# Patient Record
Sex: Male | Born: 1975 | Race: White | Hispanic: No | Marital: Married | Smoking: Current every day smoker
Health system: Southern US, Community
[De-identification: ages and names within clinical notes are randomized; demographics above are authoritative.]

## PROBLEM LIST (undated history)

## (undated) HISTORY — PX: LEG SURGERY: SHX1003

---

## 1999-06-24 ENCOUNTER — Encounter: Payer: Self-pay | Admitting: Emergency Medicine

## 1999-06-24 ENCOUNTER — Emergency Department (HOSPITAL_COMMUNITY): Admission: EM | Admit: 1999-06-24 | Discharge: 1999-06-24 | Payer: Self-pay | Admitting: Emergency Medicine

## 2000-08-23 ENCOUNTER — Emergency Department (HOSPITAL_COMMUNITY): Admission: EM | Admit: 2000-08-23 | Discharge: 2000-08-23 | Payer: Self-pay | Admitting: Internal Medicine

## 2000-08-23 ENCOUNTER — Encounter: Payer: Self-pay | Admitting: Internal Medicine

## 2000-11-06 ENCOUNTER — Emergency Department (HOSPITAL_COMMUNITY): Admission: EM | Admit: 2000-11-06 | Discharge: 2000-11-06 | Payer: Self-pay | Admitting: Emergency Medicine

## 2000-11-13 ENCOUNTER — Emergency Department (HOSPITAL_COMMUNITY): Admission: EM | Admit: 2000-11-13 | Discharge: 2000-11-13 | Payer: Self-pay | Admitting: Emergency Medicine

## 2000-11-13 ENCOUNTER — Encounter: Payer: Self-pay | Admitting: Emergency Medicine

## 2001-01-08 ENCOUNTER — Emergency Department (HOSPITAL_COMMUNITY): Admission: EM | Admit: 2001-01-08 | Discharge: 2001-01-08 | Payer: Self-pay | Admitting: Emergency Medicine

## 2001-08-17 ENCOUNTER — Emergency Department (HOSPITAL_COMMUNITY): Admission: EM | Admit: 2001-08-17 | Discharge: 2001-08-18 | Payer: Self-pay | Admitting: Emergency Medicine

## 2001-08-18 ENCOUNTER — Encounter: Payer: Self-pay | Admitting: Emergency Medicine

## 2003-04-17 ENCOUNTER — Emergency Department (HOSPITAL_COMMUNITY): Admission: EM | Admit: 2003-04-17 | Discharge: 2003-04-17 | Payer: Self-pay | Admitting: Emergency Medicine

## 2003-08-31 ENCOUNTER — Emergency Department (HOSPITAL_COMMUNITY): Admission: EM | Admit: 2003-08-31 | Discharge: 2003-08-31 | Payer: Self-pay | Admitting: Emergency Medicine

## 2003-09-13 ENCOUNTER — Emergency Department (HOSPITAL_COMMUNITY): Admission: EM | Admit: 2003-09-13 | Discharge: 2003-09-13 | Payer: Self-pay | Admitting: Emergency Medicine

## 2004-07-22 ENCOUNTER — Emergency Department (HOSPITAL_COMMUNITY): Admission: EM | Admit: 2004-07-22 | Discharge: 2004-07-23 | Payer: Self-pay | Admitting: Emergency Medicine

## 2006-01-30 ENCOUNTER — Emergency Department (HOSPITAL_COMMUNITY): Admission: EM | Admit: 2006-01-30 | Discharge: 2006-01-31 | Payer: Self-pay | Admitting: Emergency Medicine

## 2006-01-31 ENCOUNTER — Emergency Department (HOSPITAL_COMMUNITY): Admission: EM | Admit: 2006-01-31 | Discharge: 2006-01-31 | Payer: Self-pay | Admitting: Emergency Medicine

## 2006-01-31 ENCOUNTER — Ambulatory Visit (HOSPITAL_COMMUNITY): Admission: RE | Admit: 2006-01-31 | Discharge: 2006-01-31 | Payer: Self-pay | Admitting: Emergency Medicine

## 2006-01-31 ENCOUNTER — Encounter: Payer: Self-pay | Admitting: Vascular Surgery

## 2006-05-19 ENCOUNTER — Emergency Department (HOSPITAL_COMMUNITY): Admission: EM | Admit: 2006-05-19 | Discharge: 2006-05-19 | Payer: Self-pay | Admitting: Emergency Medicine

## 2006-08-07 ENCOUNTER — Emergency Department (HOSPITAL_COMMUNITY): Admission: EM | Admit: 2006-08-07 | Discharge: 2006-08-07 | Payer: Self-pay | Admitting: Emergency Medicine

## 2006-09-16 ENCOUNTER — Emergency Department (HOSPITAL_COMMUNITY): Admission: EM | Admit: 2006-09-16 | Discharge: 2006-09-16 | Payer: Self-pay | Admitting: Emergency Medicine

## 2006-12-18 ENCOUNTER — Emergency Department (HOSPITAL_COMMUNITY): Admission: EM | Admit: 2006-12-18 | Discharge: 2006-12-18 | Payer: Self-pay | Admitting: Emergency Medicine

## 2008-02-14 ENCOUNTER — Emergency Department (HOSPITAL_COMMUNITY): Admission: EM | Admit: 2008-02-14 | Discharge: 2008-02-14 | Payer: Self-pay | Admitting: Emergency Medicine

## 2008-11-14 ENCOUNTER — Inpatient Hospital Stay (HOSPITAL_COMMUNITY): Admission: EM | Admit: 2008-11-14 | Discharge: 2008-11-14 | Payer: Self-pay | Admitting: Emergency Medicine

## 2008-11-29 ENCOUNTER — Emergency Department (HOSPITAL_COMMUNITY): Admission: EM | Admit: 2008-11-29 | Discharge: 2008-11-30 | Payer: Self-pay | Admitting: Emergency Medicine

## 2008-12-25 ENCOUNTER — Emergency Department (HOSPITAL_COMMUNITY): Admission: EM | Admit: 2008-12-25 | Discharge: 2008-12-25 | Payer: Self-pay | Admitting: Emergency Medicine

## 2010-06-09 ENCOUNTER — Emergency Department (HOSPITAL_COMMUNITY): Payer: Self-pay

## 2010-06-09 ENCOUNTER — Emergency Department (HOSPITAL_COMMUNITY)
Admission: EM | Admit: 2010-06-09 | Discharge: 2010-06-09 | Disposition: A | Discharge status: Home / Self Care | Payer: Self-pay | Attending: Emergency Medicine | Admitting: Emergency Medicine

## 2010-06-09 DIAGNOSIS — Z9889 Other specified postprocedural states: Secondary | ICD-10-CM | POA: Insufficient documentation

## 2010-06-09 DIAGNOSIS — S8990XA Unspecified injury of unspecified lower leg, initial encounter: Secondary | ICD-10-CM | POA: Insufficient documentation

## 2010-06-09 DIAGNOSIS — M25579 Pain in unspecified ankle and joints of unspecified foot: Secondary | ICD-10-CM | POA: Insufficient documentation

## 2010-06-09 DIAGNOSIS — W010XXA Fall on same level from slipping, tripping and stumbling without subsequent striking against object, initial encounter: Secondary | ICD-10-CM | POA: Insufficient documentation

## 2010-06-09 DIAGNOSIS — S99929A Unspecified injury of unspecified foot, initial encounter: Secondary | ICD-10-CM | POA: Insufficient documentation

## 2010-06-09 DIAGNOSIS — Y92009 Unspecified place in unspecified non-institutional (private) residence as the place of occurrence of the external cause: Secondary | ICD-10-CM | POA: Insufficient documentation

## 2010-06-09 DIAGNOSIS — F411 Generalized anxiety disorder: Secondary | ICD-10-CM | POA: Insufficient documentation

## 2010-07-24 LAB — DIFFERENTIAL
Basophils Relative: 1 % (ref 0–1)
Eosinophils Relative: 0 % (ref 0–5)
Lymphocytes Relative: 12 % (ref 12–46)
Monocytes Absolute: 0.5 10*3/uL (ref 0.1–1.0)
Neutrophils Relative %: 85 % — ABNORMAL HIGH (ref 43–77)

## 2010-07-24 LAB — CBC
HCT: 43.1 % (ref 39.0–52.0)
Hemoglobin: 14.9 g/dL (ref 13.0–17.0)
MCHC: 34.7 g/dL (ref 30.0–36.0)
RBC: 4.8 MIL/uL (ref 4.22–5.81)
WBC: 20.5 10*3/uL — ABNORMAL HIGH (ref 4.0–10.5)

## 2010-07-24 LAB — URINE MICROSCOPIC-ADD ON

## 2010-07-24 LAB — URINALYSIS, ROUTINE W REFLEX MICROSCOPIC
Glucose, UA: NEGATIVE mg/dL
Hgb urine dipstick: NEGATIVE
Nitrite: NEGATIVE
Protein, ur: 30 mg/dL — AB

## 2010-07-24 LAB — ETHANOL: Alcohol, Ethyl (B): 130 mg/dL — ABNORMAL HIGH (ref 0–10)

## 2010-07-24 LAB — POCT I-STAT, CHEM 8
BUN: 9 mg/dL (ref 6–23)
Calcium, Ion: 1.04 mmol/L — ABNORMAL LOW (ref 1.12–1.32)
Chloride: 105 mEq/L (ref 96–112)
HCT: 45 % (ref 39.0–52.0)
Hemoglobin: 15.3 g/dL (ref 13.0–17.0)
Potassium: 3.6 mEq/L (ref 3.5–5.1)

## 2010-07-24 LAB — URINE CULTURE
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
## 2010-11-29 ENCOUNTER — Emergency Department (HOSPITAL_COMMUNITY): Payer: Self-pay

## 2010-11-29 ENCOUNTER — Emergency Department (HOSPITAL_COMMUNITY)
Admission: EM | Admit: 2010-11-29 | Discharge: 2010-11-30 | Disposition: A | Discharge status: Home / Self Care | Payer: Self-pay | Attending: Emergency Medicine | Admitting: Emergency Medicine

## 2010-11-29 DIAGNOSIS — X500XXA Overexertion from strenuous movement or load, initial encounter: Secondary | ICD-10-CM | POA: Insufficient documentation

## 2010-11-29 DIAGNOSIS — S8990XA Unspecified injury of unspecified lower leg, initial encounter: Secondary | ICD-10-CM | POA: Insufficient documentation

## 2010-11-29 DIAGNOSIS — S99919A Unspecified injury of unspecified ankle, initial encounter: Secondary | ICD-10-CM | POA: Insufficient documentation

## 2010-11-29 DIAGNOSIS — M25569 Pain in unspecified knee: Secondary | ICD-10-CM | POA: Insufficient documentation

## 2010-11-29 DIAGNOSIS — R209 Unspecified disturbances of skin sensation: Secondary | ICD-10-CM | POA: Insufficient documentation

## 2011-02-02 LAB — BASIC METABOLIC PANEL
Creatinine, Ser: 0.88
GFR calc Af Amer: >60
Potassium: 3.9
Sodium: 140

## 2011-02-02 LAB — DIFFERENTIAL
Basophils Absolute: 0.1
Basophils Relative: 1
Eosinophils Relative: 3
Lymphocytes Relative: 25
Monocytes Relative: 5
Neutrophils Relative %: 66

## 2011-02-02 LAB — CBC
MCHC: 35.1
MCV: 86.1
RDW: 12.1
WBC: 9.9

## 2011-12-14 ENCOUNTER — Encounter (HOSPITAL_COMMUNITY): Payer: Self-pay | Admitting: Emergency Medicine

## 2011-12-14 ENCOUNTER — Emergency Department (HOSPITAL_COMMUNITY): Payer: Self-pay

## 2011-12-14 ENCOUNTER — Emergency Department (HOSPITAL_COMMUNITY)
Admission: EM | Admit: 2011-12-14 | Discharge: 2011-12-14 | Disposition: A | Discharge status: Home / Self Care | Payer: Self-pay | Attending: Emergency Medicine | Admitting: Emergency Medicine

## 2011-12-14 DIAGNOSIS — M5430 Sciatica, unspecified side: Secondary | ICD-10-CM | POA: Insufficient documentation

## 2011-12-14 DIAGNOSIS — F17200 Nicotine dependence, unspecified, uncomplicated: Secondary | ICD-10-CM | POA: Insufficient documentation

## 2011-12-14 DIAGNOSIS — S022XXA Fracture of nasal bones, initial encounter for closed fracture: Secondary | ICD-10-CM | POA: Insufficient documentation

## 2011-12-14 MED ORDER — IBUPROFEN 800 MG PO TABS: 600.0000 mg | ORAL_TABLET | Freq: Three times a day (TID) | ORAL | Status: AC

## 2011-12-14 MED ORDER — OXYCODONE-ACETAMINOPHEN 5-325 MG PO TABS
ORAL_TABLET | ORAL | Status: AC
  Administered 2011-12-14: 2 via ORAL
  Filled 2011-12-14: qty 2

## 2011-12-14 MED ORDER — OXYCODONE-ACETAMINOPHEN 5-325 MG PO TABS: 2.0000 | ORAL_TABLET | ORAL | Status: AC | PRN

## 2011-12-14 MED ORDER — OXYCODONE-ACETAMINOPHEN 5-325 MG PO TABS
2.0000 | ORAL_TABLET | Freq: Once | ORAL | Status: AC
  Administered 2011-12-14: 2 via ORAL

## 2011-12-14 MED ORDER — CYCLOBENZAPRINE HCL 10 MG PO TABS: 10.0000 mg | ORAL_TABLET | Freq: Two times a day (BID) | ORAL | Status: AC | PRN
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
## 2011-12-14 NOTE — ED Notes (Signed)
Patient with no complaints at this time. Respirations even and unlabored. Skin warm/dry. Discharge instructions reviewed with patient at this time. Patient given opportunity to voice concerns/ask questions. Patient discharged at this time and left Emergency Department with steady gait.   

## 2011-12-14 NOTE — ED Provider Notes (Signed)
Medical screening examination/treatment/procedure(s) were performed by non-physician practitioner and as supervising physician I was immediately available for consultation/collaboration.   Glynn Octave, MD 12/14/11 1520
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
## 2011-12-17 ENCOUNTER — Encounter (HOSPITAL_COMMUNITY): Payer: Self-pay | Admitting: Emergency Medicine

## 2011-12-17 ENCOUNTER — Emergency Department (HOSPITAL_COMMUNITY)
Admission: EM | Admit: 2011-12-17 | Discharge: 2011-12-17 | Disposition: A | Discharge status: Home / Self Care | Payer: Self-pay | Attending: Emergency Medicine | Admitting: Emergency Medicine

## 2011-12-17 DIAGNOSIS — F17200 Nicotine dependence, unspecified, uncomplicated: Secondary | ICD-10-CM | POA: Insufficient documentation

## 2011-12-17 DIAGNOSIS — L0501 Pilonidal cyst with abscess: Secondary | ICD-10-CM | POA: Insufficient documentation

## 2011-12-17 MED ORDER — DOXYCYCLINE HYCLATE 100 MG PO TABS
100.0000 mg | ORAL_TABLET | Freq: Once | ORAL | Status: AC
  Administered 2011-12-17: 100 mg via ORAL
  Filled 2011-12-17: qty 1

## 2011-12-17 MED ORDER — DOXYCYCLINE HYCLATE 100 MG PO CAPS: 100.0000 mg | ORAL_CAPSULE | Freq: Two times a day (BID) | ORAL | Status: AC

## 2011-12-17 NOTE — ED Notes (Signed)
Pt c/o boil to lower back x 4 days

## 2011-12-17 NOTE — ED Provider Notes (Signed)
History     CSN: [id number]  Arrival date & time 12/17/11  1544   First MD Initiated Contact with Patient 12/17/11 1726      Chief Complaint  Patient presents with  . Abscess    (Consider location/radiation/quality/duration/timing/severity/associated sxs/prior treatment) Patient is a 36 y.o. male presenting with abscess. The history is provided by the patient. No language interpreter was used.  Abscess  This is a new problem. Episode onset: 2 days ago. Affected Location: pilonidal region. The problem is moderate. The abscess is characterized by painfulness and draining. The abscess first occurred at home. Pertinent negatives include no fever. There were no sick contacts. He has received no recent medical care.    History reviewed. No pertinent past medical history.  Past Surgical History  Procedure Date  . Leg surgery     No family history on file.  History  Substance Use Topics  . Smoking status: Current Everyday Smoker    Types: Cigarettes  . Smokeless tobacco: Not on file  . Alcohol Use: Yes      Review of Systems  Constitutional: Negative for fever and chills.  Skin:       Abscess   All other systems reviewed and are negative.    Allergies  Review of patient's allergies indicates no known allergies.  Home Medications   Current Outpatient Rx  Name Route Sig Dispense Refill  . CYCLOBENZAPRINE HCL 10 MG PO TABS Oral Take 1 tablet (10 mg total) by mouth 2 (two) times daily as needed for muscle spasms. 15 tablet 0  . OXYCODONE-ACETAMINOPHEN 5-325 MG PO TABS Oral Take 2 tablets by mouth every 4 (four) hours as needed for pain. 15 tablet 0  . DOXYCYCLINE HYCLATE 100 MG PO CAPS Oral Take 1 capsule (100 mg total) by mouth 2 (two) times daily. 20 capsule 0  . IBUPROFEN 800 MG PO TABS Oral Take 1 tablet (800 mg total) by mouth 3 (three) times daily. 21 tablet 0    BP 142/87  Pulse 84  Temp 98.3 F (36.8 C)  Resp 16  Ht 6\' 4"  (1.93 m)  Wt 200 lb (90.719 kg)   BMI 24.34 kg/m2  SpO2 98%  Physical Exam  Nursing note and vitals reviewed. Constitutional: He is oriented to person, place, and time. He appears well-developed and well-nourished.  HENT:  Head: Normocephalic and atraumatic.  Eyes: EOM are normal.  Neck: Normal range of motion.  Cardiovascular: Normal rate, regular rhythm, normal heart sounds and intact distal pulses.   Pulmonary/Chest: Effort normal and breath sounds normal. No respiratory distress.  Abdominal: Soft. He exhibits no distension. There is no tenderness.  Musculoskeletal: Normal range of motion. He exhibits tenderness.       Back:  Neurological: He is alert and oriented to person, place, and time.  Skin: Skin is warm and dry.  Psychiatric: He has a normal mood and affect. Judgment normal.    ED Course  Procedures (including critical care time)  Labs Reviewed - No data to display No results found.   1. Pilonidal abscess       MDM  rx-doxycycline, 20 Warm compresses Return prn I&D not warranted presently b/c abscess is draining freely.        Evalina Field, [place] 12/17/11 [phone]

## 2011-12-17 NOTE — ED Provider Notes (Signed)
Medical screening examination/treatment/procedure(s) were performed by non-physician practitioner and as supervising physician I was immediately available for consultation/collaboration.   Hazen Brumett L Isair Inabinet, MD 12/17/11 2238 

## 2013-03-15 ENCOUNTER — Encounter (HOSPITAL_COMMUNITY): Payer: Self-pay | Admitting: Emergency Medicine

## 2013-03-15 ENCOUNTER — Emergency Department (HOSPITAL_COMMUNITY)
Admission: EM | Admit: 2013-03-15 | Discharge: 2013-03-15 | Disposition: A | Discharge status: Home / Self Care | Payer: Self-pay | Attending: Emergency Medicine | Admitting: Emergency Medicine

## 2013-03-15 DIAGNOSIS — F17200 Nicotine dependence, unspecified, uncomplicated: Secondary | ICD-10-CM | POA: Insufficient documentation

## 2013-03-15 DIAGNOSIS — K0889 Other specified disorders of teeth and supporting structures: Secondary | ICD-10-CM | POA: Diagnosis present

## 2013-03-15 DIAGNOSIS — K089 Disorder of teeth and supporting structures, unspecified: Secondary | ICD-10-CM | POA: Insufficient documentation

## 2013-03-15 DIAGNOSIS — L02219 Cutaneous abscess of trunk, unspecified: Secondary | ICD-10-CM | POA: Insufficient documentation

## 2013-03-15 DIAGNOSIS — IMO0002 Reserved for concepts with insufficient information to code with codable children: Secondary | ICD-10-CM | POA: Diagnosis present

## 2013-03-15 MED ORDER — PENICILLIN V POTASSIUM 500 MG PO TABS: 500.0000 mg | ORAL_TABLET | Freq: Four times a day (QID) | ORAL | Status: AC

## 2013-03-15 MED ORDER — OXYCODONE-ACETAMINOPHEN 5-325 MG PO TABS
1.0000 | ORAL_TABLET | Freq: Once | ORAL | Status: AC
  Administered 2013-03-15: 1 via ORAL
  Filled 2013-03-15: qty 1

## 2013-03-15 NOTE — ED Notes (Signed)
MD at bedside. 

## 2013-03-15 NOTE — ED Notes (Signed)
Suture cart at BS.  

## 2013-03-15 NOTE — ED Provider Notes (Signed)
CSN: [id number]     Arrival date & time 03/15/13  0431 History   First MD Initiated Contact with Patient 03/15/13 0448     Chief Complaint  Patient presents with  . Abscess   (Consider location/radiation/quality/duration/timing/severity/associated sxs/prior Treatment) Patient is a 37 y.o. male presenting with abscess.  Abscess Abscess location: LLQ of abdomen. Size:  4x4 cm Abscess quality: induration, painful, redness and warmth   Red streaking: no   Duration:  3 days Progression:  Worsening Pain details:    Quality:  Dull   Severity:  Mild   Duration:  3 days   Timing:  Constant   Progression:  Worsening Chronicity:  New Relieved by:  Nothing Worsened by:  Nothing tried Ineffective treatments:  None tried Associated symptoms: no fever, no headaches, no nausea and no vomiting     History reviewed. No pertinent past medical history. Past Surgical History  Procedure Laterality Date  . Leg surgery     No family history on file. History  Substance Use Topics  . Smoking status: Current Every Day Smoker    Types: Cigarettes  . Smokeless tobacco: Not on file  . Alcohol Use: Yes    Review of Systems  Constitutional: Negative for fever.  HENT: Negative for drooling and rhinorrhea.   Eyes: Negative for pain.  Respiratory: Negative for cough and shortness of breath.   Cardiovascular: Negative for chest pain and leg swelling.  Gastrointestinal: Negative for nausea, vomiting, abdominal pain and diarrhea.  Genitourinary: Negative for dysuria and hematuria.  Musculoskeletal: Negative for gait problem and neck pain.  Skin: Negative for color change.  Neurological: Negative for numbness and headaches.  Hematological: Negative for adenopathy.  Psychiatric/Behavioral: Negative for behavioral problems.  All other systems reviewed and are negative.    Allergies  Review of patient's allergies indicates no known allergies.  Home Medications  No current outpatient  prescriptions on file. BP 129/76  Pulse 103  Temp(Src) 98.3 F (36.8 C) (Oral)  Ht 6\' 4"  (1.93 m)  Wt 224 lb (101.606 kg)  BMI 27.28 kg/m2  SpO2 98% Physical Exam  Nursing note and vitals reviewed. Constitutional: He is oriented to person, place, and time. He appears well-developed and well-nourished.  HENT:  Head: Normocephalic and atraumatic.  Right Ear: External ear normal.  Left Ear: External ear normal.  Nose: Nose normal.  Mouth/Throat: Oropharynx is clear and moist. No oropharyngeal exudate.  Rennis Harding type III fracture which appears chronic to his right lower first molar.  Poor dentition diffusely. No trismus noted. Normal range of motion of his neck.  No perioral abscess noted. Normal appearing posterior oropharynx.  Eyes: Conjunctivae and EOM are normal. Pupils are equal, round, and reactive to light.  Neck: Normal range of motion. Neck supple.  Cardiovascular: Normal rate, regular rhythm, normal heart sounds and intact distal pulses.  Exam reveals no gallop and no friction rub.   No murmur heard. Pulmonary/Chest: Effort normal and breath sounds normal. No respiratory distress. He has no wheezes.  Abdominal: Soft. Bowel sounds are normal. He exhibits no distension. There is no tenderness. There is no rebound and no guarding.  4x4 cm area of erythema in the left lower quadrant of his abdomen. Mild induration noted.  Musculoskeletal: Normal range of motion. He exhibits no edema and no tenderness.  Neurological: He is alert and oriented to person, place, and time.  Skin: Skin is warm and dry.  Psychiatric: He has a normal mood and affect. His behavior is normal.  ED Course  INCISION AND DRAINAGE Date/Time: 03/15/2013 6:18 AM Performed by: Purvis Sheffield, S Authorized by: Purvis Sheffield, S Consent: Verbal consent obtained. Risks and benefits: risks, benefits and alternatives were discussed Consent given by: patient Patient understanding: patient states  understanding of the procedure being performed Required items: required blood products, implants, devices, and special equipment available Patient identity confirmed: verbally with patient, arm band, provided demographic data and hospital-assigned identification number Time out: Immediately prior to procedure a "time out" was called to verify the correct patient, procedure, equipment, support staff and site/side marked as required. Type: abscess Body area: trunk Location details: abdomen Anesthesia: local infiltration Local anesthetic: lidocaine 1% with epinephrine Anesthetic total: 5 ml Patient sedated: no Scalpel size: 11 Incision type: single straight Complexity: simple Drainage: purulent Drainage amount: moderate Wound treatment: wound left open Patient tolerance: Patient tolerated the procedure well with no immediate complications.   (including critical care time) Labs Review Labs Reviewed - No data to display Imaging Review No results found.  EKG Interpretation   None       MDM   1. Pain, dental   2. Abdominal abscess    5:10 AM 37 y.o. male who presents with dental pain and an abscess in the left lower quadrant of his abdomen both which began several days ago. The patient has a history of cutaneous body abscesses which she has required incision and drainage in the past. 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 He notes that it has only started to her in the last few days. He denies any fevers. He is afebrile and slightly tachycardic here. He reports that he has had alcohol to drink this evening. Bedside ultrasound shows fluid accumulation in the abscess in his left lower quadrant which I will incise and drain. No obvious peri oral abscess. Will recommend followup with dentistry for that.  6:17 AM: I performed the incision and drainage of the abdominal abscess without complication. I also applied  calcium hydroxide to the fractured tooth. Will have the patient followup with dentistry. I have discussed the diagnosis/risks/treatment options with the patient and family and believe the pt to be eligible for discharge home to follow-up with a dentist next week. We also discussed returning to the ED immediately if new or worsening sx occur. We discussed the sx which are most concerning (e.g., worsening pain, fever, difficulty handling secretions, inc redness around abscess) that necessitate immediate return. Any new prescriptions provided to the patient are listed below.  New Prescriptions   PENICILLIN V POTASSIUM (VEETID) 500 MG TABLET    Take 1 tablet (500 mg total) by mouth 4 (four) times daily.       Junius Argyle, MD 03/15/13 [phone]

## 2013-03-15 NOTE — ED Notes (Signed)
BP: 140/90, HR: 120, RR: 20  Pt presents to the department with two abscesses. One abscess is on the pt's lower left abdomen, pt states it has increased in size greatly in the past day, pt states he first noticed the abscess three days ago. Pt also has an abscess on his lower left back side of his mouth that is causing a terrible HA. Pt states he has drank 2 40s tonight.

## 2013-05-07 ENCOUNTER — Telehealth (HOSPITAL_COMMUNITY): Payer: Self-pay

## 2013-05-07 ENCOUNTER — Emergency Department (HOSPITAL_COMMUNITY)
Admission: EM | Admit: 2013-05-07 | Discharge: 2013-05-07 | Disposition: A | Discharge status: Home / Self Care | Payer: Self-pay | Attending: Emergency Medicine | Admitting: Emergency Medicine

## 2013-05-07 ENCOUNTER — Encounter (HOSPITAL_COMMUNITY): Payer: Self-pay | Admitting: Emergency Medicine

## 2013-05-07 DIAGNOSIS — R000 Tachycardia, unspecified: Secondary | ICD-10-CM | POA: Insufficient documentation

## 2013-05-07 DIAGNOSIS — K029 Dental caries, unspecified: Secondary | ICD-10-CM | POA: Insufficient documentation

## 2013-05-07 DIAGNOSIS — F17200 Nicotine dependence, unspecified, uncomplicated: Secondary | ICD-10-CM | POA: Insufficient documentation

## 2013-05-07 DIAGNOSIS — K047 Periapical abscess without sinus: Secondary | ICD-10-CM | POA: Insufficient documentation

## 2013-05-07 MED ORDER — PENICILLIN V POTASSIUM 250 MG PO TABS
500.0000 mg | ORAL_TABLET | Freq: Once | ORAL | Status: AC
  Administered 2013-05-07: 500 mg via ORAL
  Filled 2013-05-07: qty 2

## 2013-05-07 MED ORDER — IBUPROFEN 800 MG PO TABS: 800.0000 mg | ORAL_TABLET | Freq: Three times a day (TID) | ORAL | Status: AC

## 2013-05-07 MED ORDER — PENICILLIN V POTASSIUM 500 MG PO TABS: 500.0000 mg | ORAL_TABLET | Freq: Three times a day (TID) | ORAL | Status: DC

## 2013-05-07 NOTE — ED Notes (Signed)
Pt. reports worsening  right lower molar pain with swelling for several weeks unrelieved by OTC pain medications .

## 2013-05-07 NOTE — ED Provider Notes (Signed)
CSN: [id number]     Arrival date & time 05/07/13  0522 History   First MD Initiated Contact with Patient 05/07/13 [phone]     Chief Complaint  Patient presents with  . Dental Pain   (Consider location/radiation/quality/duration/timing/severity/associated sxs/prior Treatment) HPI Comments: Patient is 38 year old male who presents with a 5 day history of right lower dental pain - he has been using "BC Powders" which he reports relieves the pain but has not done anything with the facial swelling.  He states that he cannot afford to see a dentist at the present time but just needs an antibiotic for the swelling.  He denies fever, chills, difficulty swallowing, inability to open his mouth.    Patient is a 38 y.o. male presenting with tooth pain. The history is provided by the patient. No language interpreter was used.  Dental Pain Location:  Lower Lower teeth location:  29/RL 2nd bicuspid Quality:  Dull and throbbing Severity:  Moderate Onset quality:  Gradual Duration:  5 days Timing:  Constant Progression:  Worsening Chronicity:  New Context: abscess, dental caries and poor dentition   Context: not trauma   Relieved by:  Nothing Worsened by:  Nothing tried Ineffective treatments:  None tried Associated symptoms: facial swelling and gum swelling   Associated symptoms: no difficulty swallowing, no drooling, no fever, no neck swelling, no oral bleeding, no oral lesions and no trismus   Risk factors: lack of dental care, periodontal disease and smoking     History reviewed. No pertinent past medical history. Past Surgical History  Procedure Laterality Date  . Leg surgery     No family history on file. History  Substance Use Topics  . Smoking status: Current Every Day Smoker    Types: Cigarettes  . Smokeless tobacco: Not on file  . Alcohol Use: Yes    Review of Systems  Constitutional: Negative for fever.  HENT: Positive for facial swelling. Negative for drooling and mouth sores.    All other systems reviewed and are negative.    Allergies  Review of patient's allergies indicates no known allergies.  Home Medications   Current Outpatient Rx  Name  Route  Sig  Dispense  Refill  . Aspirin-Salicylamide-Caffeine (BC HEADACHE POWDER PO)   Oral   Take 1 packet by mouth 3 (three) times daily as needed (pain).          BP 147/100  Pulse 120  Temp(Src) 98.6 F (37 C) (Oral)  Resp 18  SpO2 95% Physical Exam  Nursing note and vitals reviewed. Constitutional: He is oriented to person, place, and time. He appears well-developed and well-nourished. No distress.  HENT:  Head: Normocephalic and atraumatic.    Right Ear: External ear normal.  Left Ear: External ear normal.  Nose: Nose normal.  Mouth/Throat: No trismus in the jaw. Dental abscesses and dental caries present. No oropharyngeal exudate.    Eyes: Conjunctivae are normal. Pupils are equal, round, and reactive to light. No scleral icterus.  Neck: Normal range of motion. Neck supple.  Cardiovascular: Regular rhythm and normal heart sounds.  Exam reveals no gallop and no friction rub.   No murmur heard. tachycardia  Pulmonary/Chest: Effort normal and breath sounds normal. No respiratory distress. He has no wheezes. He has no rales.  Abdominal: Soft. Bowel sounds are normal. He exhibits no distension. There is no tenderness.  Musculoskeletal: Normal range of motion. He exhibits no edema and no tenderness.  Lymphadenopathy:    He has no cervical  adenopathy.  Neurological: He is alert and oriented to person, place, and time. He exhibits normal muscle tone. Coordination normal.  Skin: Skin is warm and dry. No rash noted. No erythema. No pallor.  Psychiatric: He has a normal mood and affect. His behavior is normal. Judgment and thought content normal.    ED Course  Procedures (including critical care time) Labs Review Labs Reviewed - No data to display Imaging Review No results found.  EKG  Interpretation   None       MDM  Right periapical abscess  Patient here with worsening right lower pre-molar dental pain - no fever, chills, trismus.  Noted with abscess, but not fluctuant at this time.  Not drainable.  Doubt deep soft tissue infection, Ludwig's angina.     Izola PriceFrances C. Marisue HumbleSanford, New JerseyPA-C 05/07/13 [phone]

## 2013-05-07 NOTE — Discharge Instructions (Signed)
Dental Abscess A dental abscess is a collection of infected fluid (pus) from a bacterial infection in the inner part of the tooth (pulp). It usually occurs at the end of the tooth's root.  CAUSES   Severe tooth decay.  Trauma to the tooth that allows bacteria to enter into the pulp, such as a broken or chipped tooth. SYMPTOMS   Severe pain in and around the infected tooth.  Swelling and redness around the abscessed tooth or in the mouth or face.  Tenderness.  Pus drainage.  Bad breath.  Bitter taste in the mouth.  Difficulty swallowing.  Difficulty opening the mouth.  Nausea.  Vomiting.  Chills.  Swollen neck glands. DIAGNOSIS   A medical and dental history will be taken.  An examination will be performed by tapping on the abscessed tooth.  X-rays may be taken of the tooth to identify the abscess. TREATMENT The goal of treatment is to eliminate the infection. You may be prescribed antibiotic medicine to stop the infection from spreading. A root canal may be performed to save the tooth. If the tooth cannot be saved, it may be pulled (extracted) and the abscess may be drained.  HOME CARE INSTRUCTIONS  Only take over-the-counter or prescription medicines for pain, fever, or discomfort as directed by your caregiver.  Rinse your mouth (gargle) often with salt water ( tsp salt in 8 oz [250 ml] of warm water) to relieve pain or swelling.  Do not drive after taking pain medicine (narcotics).  Do not apply heat to the outside of your face.  Return to your dentist for further treatment as directed. SEEK MEDICAL CARE IF:  Your pain is not helped by medicine.  Your pain is getting worse instead of better. SEEK IMMEDIATE MEDICAL CARE IF:  You have a fever or persistent symptoms for more than 2 3 days.  You have a fever and your symptoms suddenly get worse.  You have chills or a very bad headache.  You have problems breathing or swallowing.  You have trouble  opening your mouth.  You have swelling in the neck or around the eye. Document Released: 04/03/2005 Document Revised: 12/27/2011 Document Reviewed: 07/12/2010 [facility] Patient Information 2014 [place], [place].  Dental Pain A tooth ache may be caused by cavities (tooth decay). Cavities expose the nerve of the tooth to air and hot or cold temperatures. It may come from an infection or abscess (also called a boil or furuncle) around your tooth. It is also often caused by dental caries (tooth decay). This causes the pain you are having. DIAGNOSIS  Your caregiver can diagnose this problem by exam. TREATMENT   If caused by an infection, it may be treated with medications which kill germs (antibiotics) and pain medications as prescribed by your caregiver. Take medications as directed.  Only take over-the-counter or prescription medicines for pain, discomfort, or fever as directed by your caregiver.  Whether the tooth ache today is caused by infection or dental disease, you should see your dentist as soon as possible for further care. SEEK MEDICAL CARE IF: The exam and treatment you received today has been provided on an emergency basis only. This is not a substitute for complete medical or dental care. If your problem worsens or new problems (symptoms) appear, and you are unable to meet with your dentist, call or return to this location. SEEK IMMEDIATE MEDICAL CARE IF:   You have a fever.  You develop redness and swelling of your face, jaw, or neck.  dental care. If your problem worsens or new problems (symptoms) appear, and you are unable to meet with your dentist, call or return to this location.  SEEK IMMEDIATE MEDICAL CARE IF:    You have a fever.   You develop redness and swelling of your face, jaw, or neck.   You are unable to open your mouth.   You have severe pain uncontrolled by pain medicine.  MAKE SURE YOU:    Understand these instructions.   Will watch your condition.   Will get help right away if you are not doing well or get worse.  Document Released: 04/03/2005 Document Revised: 06/26/2011 Document Reviewed: 11/20/2007  [employer] Patient Information 2014 [employer].

## 2013-05-07 NOTE — ED Notes (Signed)
Pt calling for on call dental provider for time of ED visit.  Reviewed On call provider list.  No provider on call for Dental, pt informed

## 2013-05-09 NOTE — ED Provider Notes (Signed)
Medical screening examination/treatment/procedure(s) were performed by non-physician practitioner and as supervising physician I was immediately available for consultation/collaboration.  EKG Interpretation   None         Candyce ChurnJohn David Mollyann Halbert, MD 05/09/13 [phone]

## 2014-03-30 ENCOUNTER — Emergency Department (HOSPITAL_COMMUNITY)
Admission: EM | Admit: 2014-03-30 | Discharge: 2014-03-30 | Disposition: A | Discharge status: Home / Self Care | Payer: Self-pay | Attending: Emergency Medicine | Admitting: Emergency Medicine

## 2014-03-30 ENCOUNTER — Encounter (HOSPITAL_COMMUNITY): Payer: Self-pay | Admitting: Emergency Medicine

## 2014-03-30 DIAGNOSIS — Z791 Long term (current) use of non-steroidal anti-inflammatories (NSAID): Secondary | ICD-10-CM | POA: Insufficient documentation

## 2014-03-30 DIAGNOSIS — Z720 Tobacco use: Secondary | ICD-10-CM | POA: Insufficient documentation

## 2014-03-30 DIAGNOSIS — Z792 Long term (current) use of antibiotics: Secondary | ICD-10-CM | POA: Insufficient documentation

## 2014-03-30 DIAGNOSIS — L0231 Cutaneous abscess of buttock: Secondary | ICD-10-CM | POA: Insufficient documentation

## 2014-03-30 DIAGNOSIS — L02214 Cutaneous abscess of groin: Secondary | ICD-10-CM | POA: Insufficient documentation

## 2014-03-30 MED ORDER — OXYCODONE-ACETAMINOPHEN 5-325 MG PO TABS
1.0000 | ORAL_TABLET | Freq: Once | ORAL | Status: AC
  Administered 2014-03-30: 1 via ORAL
  Filled 2014-03-30: qty 1

## 2014-03-30 MED ORDER — DOXYCYCLINE HYCLATE 100 MG PO CAPS: 100.0000 mg | ORAL_CAPSULE | Freq: Two times a day (BID) | ORAL | Status: AC

## 2014-03-30 MED ORDER — OXYCODONE-ACETAMINOPHEN 5-325 MG PO TABS: 1.0000 | ORAL_TABLET | Freq: Three times a day (TID) | ORAL | Status: DC | PRN

## 2014-03-30 MED ORDER — LIDOCAINE-EPINEPHRINE (PF) 2 %-1:200000 IJ SOLN
20.0000 mL | Freq: Once | INTRAMUSCULAR | Status: AC
  Administered 2014-03-30: 20 mL via INTRADERMAL
  Filled 2014-03-30: qty 20

## 2014-03-30 NOTE — Discharge Instructions (Signed)
Please continue to apply warm compress to affected region several times daily.  Wash it with dial antibacteria soap.  Take pain medication and antibiotic as prescribed.  If no improvement [facility].  Abscess Care After An abscess (also called a boil or furuncle) is an infected area that contains a collection of pus. Signs and symptoms of an abscess include pain, tenderness, redness, or hardness, or you may feel a moveable soft area under your skin. An abscess can occur anywhere in the body. The infection may spread to surrounding tissues causing cellulitis. A cut (incision) by the surgeon was made over your abscess and the pus was drained out. Gauze may have been packed into the space to provide a drain that will allow the cavity to heal from the inside outwards. The boil may be painful for 5 to 7 days. Most people with a boil do not have high fevers. Your abscess, if seen early, may not have localized, and may not have been lanced. If not, another appointment may be required for this if it does not get better on its own or with medications. HOME CARE INSTRUCTIONS   Only take over-the-counter or prescription medicines for pain, discomfort, or fever as directed by your caregiver.  When you bathe, soak and then remove gauze or iodoform packs at least daily or as directed by your caregiver. You may then wash the wound gently with mild soapy water. Repack with gauze or do as your caregiver directs. SEEK IMMEDIATE MEDICAL CARE IF:   You develop increased pain, swelling, redness, drainage, or bleeding in the wound site.  You develop signs of generalized infection including muscle aches, chills, fever, or a general ill feeling.  An oral temperature above 102 F (38.9 C) develops, not controlled by medication. See your caregiver for a recheck if you develop any of the symptoms described above. If medications (antibiotics) were prescribed, take them as  directed. Document Released: 10/20/2004 Document Revised: 06/26/2011 Document Reviewed: 06/17/2007 [facility] Patient Information 2015 [place]. This information is not intended to replace advice given to you by your health care provider. Make sure you discuss any questions you have with your health care provider.

## 2014-03-30 NOTE — ED Notes (Addendum)
Pt has abscess near anus and on scrotum. Pt rates pain 10/10.

## 2014-03-30 NOTE — ED Notes (Signed)
[facility]. Pt has two boils one on scrotum, and the other to the back of left leg. 10/10 pain.

## 2014-03-30 NOTE — ED Provider Notes (Signed)
CSN: [id number]     Arrival date & time 03/30/14  0039 History   First MD Initiated Contact with Patient 03/30/14 0043     Chief Complaint  Patient presents with  . 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 Patient was brought here via [facility] from home.  Patient states for the past 2 weeks he has noticed an abscess one on his scrotum and the other one is to his buttock. Symptoms get progressively worse, not improve with over-the-counter treatments. He tries to number with alcohol today with minimal relief. He is now complaining of 10 out of 10 sharp throbbing pain. No associated fever, chills, abdominal pain, dysuria, hematochezia or melena. Denies any penile pain, or discharge. No dysuria. No history of immunocompromise.  History reviewed. No pertinent past medical history. Past Surgical History  Procedure Laterality Date  . Leg surgery     History reviewed. No pertinent family history. History  Substance Use Topics  . Smoking status: Current Every Day Smoker    Types: Cigarettes  . Smokeless tobacco: Not on file  . Alcohol Use: Yes    Review of Systems  Constitutional: Negative for fever.  Skin: Positive for rash.      Allergies  Review of patient's allergies indicates no known allergies.  Home Medications   Prior to Admission medications   Medication Sig Start Date End Date Taking? Authorizing Provider  Aspirin-Salicylamide-Caffeine (BC HEADACHE POWDER PO) Take 1 packet by mouth 3 (three) times daily as needed (pain).    Historical Provider, MD  ibuprofen (ADVIL,MOTRIN) 800 MG tablet Take 1 tablet (800 mg total) by mouth 3 (three) times daily. 05/07/13   Izola PriceFrances C. Sanford, PA-C  penicillin v potassium (VEETID) 500 MG tablet Take 1 tablet (500 mg total) by mouth 3 (three) times daily. 05/07/13    Izola PriceFrances C. Sanford, PA-C   BP 140/90 mmHg  Pulse 112  Resp 18  SpO2 98% Physical Exam  Constitutional: He appears well-developed and well-nourished. No distress.  HENT:  Head: Atraumatic.  Eyes: Conjunctivae are normal.  Neck: Normal range of motion. Neck supple.  Neurological: He is alert.  Skin: Rash (L inguinal region with several cutaneous abscess without cellulitis; TTP.  L medial buttock with cutaneous abscess adjacent to rectum without rectal involvement, no cellulitis, ttp.) noted.  Psychiatric: He has a normal mood and affect.    ED Course  Procedures (including critical care time)  1:17 AM Pt has multiple small abscesses in his L inguinal region and a moderate size abscess noted to L gluteal region which i performed I&D with small amount of pustular drainage.  No cellulitis.  No rectal involvement, no systemic involvement.  Will d/c with pain meds, and abx.  Will refer to [facility] for further care if no improvement.  Care instruction provided.   INCISION AND DRAINAGE Performed by: Fayrene HelperRAN,Fedrick Cefalu Consent: Verbal consent obtained. Risks and benefits: risks, benefits and alternatives were discussed Type: abscess  Body area: L medial gluteal region  Anesthesia: local infiltration  Incision was made with a scalpel.  Local anesthetic: lidocaine 2% w epinephrine  Anesthetic total: 3 ml  Complexity: complex Blunt dissection to break up loculations  Drainage: purulent  Drainage amount: small  Packing material: none Patient tolerance: Patient tolerated the procedure well with no immediate complications.     Labs Review Labs Reviewed - No data to display  Imaging Review No  results found.   EKG Interpretation None      MDM   Final diagnoses:  Left buttock abscess  Soft tissue abscess of inguinal region    BP 127/76 mmHg  Pulse 94  Temp(Src) 97.7 F (36.5 C) (Oral)  Resp 20  SpO2 98%     Fayrene HelperBowie Elijahjames Fuelling, PA-C 03/30/14 0123  Geoffery Lyonsouglas Delo, MD 03/30/14  [phone]

## 2014-04-21 ENCOUNTER — Encounter (HOSPITAL_COMMUNITY): Payer: Self-pay | Admitting: Emergency Medicine

## 2014-04-21 ENCOUNTER — Emergency Department (HOSPITAL_COMMUNITY)
Admission: EM | Admit: 2014-04-21 | Discharge: 2014-04-21 | Disposition: A | Discharge status: Home / Self Care | Payer: Self-pay | Attending: Emergency Medicine | Admitting: Emergency Medicine

## 2014-04-21 ENCOUNTER — Emergency Department (HOSPITAL_COMMUNITY): Payer: Self-pay

## 2014-04-21 DIAGNOSIS — Y998 Other external cause status: Secondary | ICD-10-CM | POA: Insufficient documentation

## 2014-04-21 DIAGNOSIS — Z7982 Long term (current) use of aspirin: Secondary | ICD-10-CM | POA: Insufficient documentation

## 2014-04-21 DIAGNOSIS — Y92410 Unspecified street and highway as the place of occurrence of the external cause: Secondary | ICD-10-CM | POA: Insufficient documentation

## 2014-04-21 DIAGNOSIS — R52 Pain, unspecified: Secondary | ICD-10-CM

## 2014-04-21 DIAGNOSIS — M549 Dorsalgia, unspecified: Secondary | ICD-10-CM

## 2014-04-21 DIAGNOSIS — M25561 Pain in right knee: Secondary | ICD-10-CM

## 2014-04-21 DIAGNOSIS — Z791 Long term (current) use of non-steroidal anti-inflammatories (NSAID): Secondary | ICD-10-CM | POA: Insufficient documentation

## 2014-04-21 DIAGNOSIS — T148XXA Other injury of unspecified body region, initial encounter: Secondary | ICD-10-CM

## 2014-04-21 DIAGNOSIS — S3992XA Unspecified injury of lower back, initial encounter: Secondary | ICD-10-CM | POA: Insufficient documentation

## 2014-04-21 DIAGNOSIS — S82001A Unspecified fracture of right patella, initial encounter for closed fracture: Secondary | ICD-10-CM | POA: Insufficient documentation

## 2014-04-21 DIAGNOSIS — Y9389 Activity, other specified: Secondary | ICD-10-CM | POA: Insufficient documentation

## 2014-04-21 DIAGNOSIS — Z720 Tobacco use: Secondary | ICD-10-CM | POA: Insufficient documentation

## 2014-04-21 DIAGNOSIS — Z79899 Other long term (current) drug therapy: Secondary | ICD-10-CM | POA: Insufficient documentation

## 2014-04-21 MED ORDER — OXYCODONE-ACETAMINOPHEN 5-325 MG PO TABS: 1.0000 | ORAL_TABLET | Freq: Three times a day (TID) | ORAL | Status: AC | PRN

## 2014-04-21 MED ORDER — OXYCODONE-ACETAMINOPHEN 5-325 MG PO TABS
1.0000 | ORAL_TABLET | Freq: Once | ORAL | Status: AC
  Administered 2014-04-21: 1 via ORAL
  Filled 2014-04-21: qty 1

## 2014-04-21 NOTE — Discharge Instructions (Signed)
Please call your doctor for a followup appointment within 24-48 hours. When you talk to your doctor please let them know that you were seen in the emergency department and have them acquire all of your records so that they can discuss the findings with you and formulate a treatment plan to fully care for your new and ongoing problems. Please call and set-up an appointment with Dr. Ophelia Charter, orthopedics physician regarding knee pain  Please rest and stay hydrated Please avoid any physical or strenuous activity  Please take medications as prescribed - while on pain medications there is to be no drinking alcohol, driving, operating any heavy machinery. If extra please dispose in a proper manner. Please do not take any extra Tylenol with this medication for this can lead to Tylenol overdose and liver issues.  Please continue to monitor symptoms closely and if symptoms are to worsen or change (fever greater than 101, chills, sweating, nausea, vomiting, chest pain, shortness of breathe, difficulty breathing, weakness, numbness, tingling, worsening or changes to pain pattern, fall, injury, loss of sensation, red streaks running down the leg) please report back to the Emergency Department immediately.   Knee Pain The knee is the complex joint between your thigh and your lower leg. It is made up of bones, tendons, ligaments, and cartilage. The bones that make up the knee are:  The femur in the thigh.  The tibia and fibula in the lower leg.  The patella or kneecap riding in the groove on the lower femur. CAUSES  Knee pain is a common complaint with many causes. A few of these causes are:  Injury, such as:  A ruptured ligament or tendon injury.  Torn cartilage.  Medical conditions, such as:  Gout  Arthritis  Infections  Overuse, over training, or overdoing a physical activity. Knee pain can be minor or severe. Knee pain can accompany debilitating injury. Minor knee problems often respond well to  self-care measures or get well on their own. More serious injuries may need medical intervention or even surgery. SYMPTOMS The knee is complex. Symptoms of knee problems can vary widely. Some of the problems are:  Pain with movement and weight bearing.  Swelling and tenderness.  Buckling of the knee.  Inability to straighten or extend your knee.  Your knee locks and you cannot straighten it.  Warmth and redness with pain and fever.  Deformity or dislocation of the kneecap. DIAGNOSIS  Determining what is wrong may be very straight forward such as when there is an injury. It can also be challenging because of the complexity of the knee. Tests to make a diagnosis may include:  Your caregiver taking a history and doing a physical exam.  Routine X-rays can be used to rule out other problems. X-rays will not reveal a cartilage tear. Some injuries of the knee can be diagnosed by:  Arthroscopy a surgical technique by which a small video camera is inserted through tiny incisions on the sides of the knee. This procedure is used to examine and repair internal knee joint problems. Tiny instruments can be used during arthroscopy to repair the torn knee cartilage (meniscus).  Arthrography is a radiology technique. A contrast liquid is directly injected into the knee joint. Internal structures of the knee joint then become visible on X-ray film.  An MRI scan is a non X-ray radiology procedure in which magnetic fields and a computer produce two- or three-dimensional images of the inside of the knee. Cartilage tears are often visible using an  MRI scanner. MRI scans have largely replaced arthrography in diagnosing cartilage tears of the knee.  Blood work.  Examination of the fluid that helps to lubricate the knee joint (synovial fluid). This is done by taking a sample out using a needle and a syringe. TREATMENT The treatment of knee problems depends on the cause. Some of these treatments  are:  Depending on the injury, proper casting, splinting, surgery, or physical therapy care will be needed.  Give yourself adequate recovery time. Do not overuse your joints. If you begin to get sore during workout routines, back off. Slow down or do fewer repetitions.  For repetitive activities such as cycling or running, maintain your strength and nutrition.  Alternate muscle groups. For example, if you are a weight lifter, work the upper body on one day and the lower body the next.  Either tight or weak muscles do not give the proper support for your knee. Tight or weak muscles do not absorb the stress placed on the knee joint. Keep the muscles surrounding the knee strong.  Take care of mechanical problems.  If you have flat feet, orthotics or special shoes may help. See your caregiver if you need help.  Arch supports, sometimes with wedges on the inner or outer aspect of the heel, can help. These can shift pressure away from the side of the knee most bothered by osteoarthritis.  A brace called an "unloader" brace also may be used to help ease the pressure on the most arthritic side of the knee.  If your caregiver has prescribed crutches, braces, wraps or ice, use as directed. The acronym for this is PRICE. This means protection, rest, ice, compression, and elevation.  Nonsteroidal anti-inflammatory drugs (NSAIDs), can help relieve pain. But if taken immediately after an injury, they may actually increase swelling. Take NSAIDs with food in your stomach. Stop them if you develop stomach problems. Do not take these if you have a history of ulcers, stomach pain, or bleeding from the bowel. Do not take without your caregiver's approval if you have problems with fluid retention, heart failure, or kidney problems.  For ongoing knee problems, physical therapy may be helpful.  Glucosamine and chondroitin are over-the-counter dietary supplements. Both may help relieve the pain of osteoarthritis  in the knee. These medicines are different from the usual anti-inflammatory drugs. Glucosamine may decrease the rate of cartilage destruction.  Injections of a corticosteroid drug into your knee joint may help reduce the symptoms of an arthritis flare-up. They may provide pain relief that lasts a few months. You may have to wait a few months between injections. The injections do have a small increased risk of infection, water retention, and elevated blood sugar levels.  Hyaluronic acid injected into damaged joints may ease pain and provide lubrication. These injections may work by reducing inflammation. A series of shots may give relief for as long as 6 months.  Topical painkillers. Applying certain ointments to your skin may help relieve the pain and stiffness of osteoarthritis. Ask your pharmacist for suggestions. Many over the-counter products are approved for temporary relief of arthritis pain.  In some countries, doctors often prescribe topical NSAIDs for relief of chronic conditions such as arthritis and tendinitis. A review of treatment with NSAID creams found that they worked as well as oral medications but without the serious side effects. PREVENTION  Maintain a healthy weight. Extra pounds put more strain on your joints.  Get strong, stay limber. Weak muscles are a common cause of  knee injuries. Stretching is important. Include flexibility exercises in your workouts.  Be smart about exercise. If you have osteoarthritis, chronic knee pain or recurring injuries, you may need to change the way you exercise. This does not mean you have to stop being active. If your knees ache after jogging or playing basketball, consider switching to swimming, water aerobics, or other low-impact activities, at least for a few days a week. Sometimes limiting high-impact activities will provide relief.  Make sure your shoes fit well. Choose footwear that is right for your sport.  Protect your knees. Use the  proper gear for knee-sensitive activities. Use kneepads when playing volleyball or laying carpet. Buckle your seat belt every time you drive. Most shattered kneecaps occur in car accidents.  Rest when you are tired. SEEK MEDICAL CARE IF:  You have knee pain that is continual and does not seem to be getting better.  SEEK IMMEDIATE MEDICAL CARE IF:  Your knee joint feels hot to the touch and you have a high fever. MAKE SURE YOU:   Understand these instructions.  Will watch your condition.  Will get help right away if you are not doing well or get worse. Document Released: 01/29/2007 Document Revised: 06/26/2011 Document Reviewed: 01/29/2007 [facility] Patient Information 2015 [employer], [place]. This information is not intended to replace advice given to you by your health care provider. Make sure you discuss any questions you have with your health care provider.   Emergency Department Resource Guide 1) Find a Doctor and Pay Out of Pocket Although you won't have to find out who is covered by your insurance plan, it is a good idea to ask around and get recommendations. You will then need to call the office and see if the doctor you have chosen will accept you as a new patient and what types of options they offer for patients who are self-pay. Some doctors offer discounts or will set up payment plans for their patients who do not have insurance, but you will need to ask so you aren't surprised when you get to your appointment.  2) Contact Your Local Health Department Not all health departments have doctors that can see patients for sick visits, but many do, so it is worth a call to see if yours does. If you don't know where your local health department is, you can check in your phone book. The CDC also has a tool to help you locate your state's health department, and many state websites also have listings of all of their local health departments.  3) Find a Walk-in Clinic If your illness is not  likely to be very severe or complicated, you may want to try a walk in clinic. These are popping up all over the country in pharmacies, drugstores, and shopping centers. They're usually staffed by nurse practitioners or physician assistants that have been trained to treat common illnesses and complaints. They're usually fairly quick and inexpensive. However, if you have serious medical issues or chronic medical problems, these are probably not your best option.  No Primary Care Doctor: - Call Health Connect at  [phone] - they can help you locate a primary care doctor that  accepts your insurance, provides certain services, etc. - Physician Referral Service- [phone]  Chronic Pain Problems: Organization         Address  Phone   Notes  [facility]  [phone] Patients need to be referred by their primary care doctor.   Medication Assistance: Organization  Address  Phone   Notes  [facility] Medication [facility] [address] [place], [place] [address] [phone] --Must be a resident of [facility] -- Must have NO insurance coverage whatsoever (no Medicaid/ Medicare, etc.) -- The pt. MUST have a primary care doctor that directs their care regularly and follows them in the community   [facility]ist  [phone]   [employer]  [phone]    Agencies that provide inexpensive medical care: Organization         Address  Phone   Notes  [facility]  [phone]   [facility]    [phone]   [facility] [place], [place] [address] [phone]   [place] [address] [place] [phone]   Planned Parenthood    [phone]   [facility]    [phone]   [place] and [place] [address], [place] Phone:  [phone], Fax:  [phone] Hours of Operation:  9 am - 6 pm,  M-F.  Also accepts Medicaid/Medicare and self-pay.  [facility] for [address], [address], [place] Phone: [phone], Fax: [phone]. Hours of Operation:  8:30 am - 5:30 pm, M-F.  Also accepts Medicaid and self-pay.  [facility] [place] [address] [place] Phone: [phone]   [place], [place], [place] [phone], Ext. [phone] Mondays & Thursdays: 7-9 AM.  First 15 patients are seen on a first come, first serve basis.    [place] Providers:  Organization         Address  Phone   Notes  [facility] [address], [place] [phone] Also accepts self-pay patients.  [facility] [address], [place]  [phone]   [place], [address], [place] [phone]   [facility] [address], [place] [phone]   Lucianne Lei [address], [place]   [phone] Only accepts [place] Access [place] patients after they have their name applied to their card.   Self-Pay (no insurance) in [facility]:  Organization         Address  Phone   Notes  Sickle Cell Patients, [facility] [place] [phone]   [facility] [place] [phone]   [facility] [place]  [place], [place], [place] [phone]   [facility]/Dr. Osei-Bonsu  [address], [place] or [address], [place] [phone] Phone number for both [place] and [place] locations is the same.  [facility]gent [facility] and [facility] [address], [place] [phone]   [facility] [address] [place] or [address] [phone]   [facility] [address], [place] [phone], phone; [phone], fax Sees patients 1st and 3rd Saturday of every month.  Must not qualify for public or private insurance (i.e. Medicaid, Medicare, [place] Health Choice, Veterans' Benefits)  Household income should be no more than 200% of the poverty level The clinic cannot treat you if you are pregnant or think you  are pregnant  Sexually transmitted diseases are not treated at the clinic.    Dental Care: Organization         Address  Phone  Notes  [facility] [place] [phone] Accepts children up to age 58 who are enrolled in [place] or [place]; pregnant women with a Medicaid card; and children who have applied for Medicaid or [place] Health Choice, but were declined, whose parents can pay a reduced fee at time of service.  [facility] of [facility]  [address], [place] [phone] Accepts children up to age 69 who are enrolled in [place] or [place]; pregnant women with a Medicaid card; and children who have applied for Medicaid or [place] Health Choice, but were declined, whose parents can pay a reduced fee at time of service.  [place] [facility]  [place] [phone] Patients are seen by appointment only. Walk-ins are not accepted. [place] will see patients 23 years of age and older. Monday - Tuesday (8am-5pm) Most Wednesdays (8:30-5pm) $30 per visit, cash only  [facility] Adult [facility] Access PRO[facility]  [address], [facility] [phone] Patients are seen by appointment only. Walk-ins are not accepted. [place] will see patients 39 years of age and older. One Wednesday Evening (Monthly: Volunteer Based).  $30 per visit, cash only  [place]  [phone] for adults; Children under age 56, call Graduate Pediatric Dentistry at [phone]. Children aged 15-14, please call [phone] to request a pediatric application.  Dental services are provided in all areas of dental care including fillings, crowns and bridges, complete and partial dentures, implants, gum treatment, root canals, and extractions. Preventive care is also provided. Treatment is provided to both adults and children. Patients are selected via a lottery and there is often a waiting list.   [facility] [address] [place]  [phone] [email or web address]   [facility] [address] [place], [place] [phone], Ext. [phone] Second and Fourth Thursday of each month, opens at 6:30 AM; Clinic ends at 9 AM.  Patients are seen on a first-come first-served basis, and a limited number are seen during each clinic.   [facility]  [address] Hillard Danker [place], [place] [phone]   Eligibility Requirements You must have lived in [place], [place], or [place] counties for at least the last three months.   You cannot be eligible for state or federal sponsored [employer], including [employer], [place], or [employer].   You generally cannot be eligible for healthcare insurance through your employer.    How to apply: Eligibility screenings are held every Tuesday and Wednesday afternoon from 1:00 pm until 4:00 pm. You do not need an appointment for the interview!  [facility] [address] [place], [place]   [place]  [place] Department  [place]  [phone]    Behavioral Health Resources in the Community: Intensive Outpatient Programs Organization         Address  Phone  Notes  [place]. [address] [place], [place] [phone]   [facility] [address], [place], [place]   ADS: Alcohol & Drug Svcs  [address], [place], [place]   [place] [address]  [place], [place] or [phone]   Substance Abuse Resources Organization         Address  Phone  Notes  Alcohol and Drug Services  [phone]   [place]  [phone]   [facility] [place]   Chinita Pester  [phone]   Residential & Outpatient Substance Abuse Program  [phone]   Psychological Services Organization         Address  Phone  Notes  [facility]  [place]  [phone]   [place] [address], [place] or [phone]    Mobile Crisis Teams Organization         Address  Phone  Notes  [facility], Mobile Crisis Care Unit  [phone]   [facility]otherapeutic [facility]  [address] [place], [place]   [place] [address] [place] [phone]    Self-Help/Support Groups Organization         Address  Phone             Notes  [place]. of [place] - variety of support groups  [place] Call for more information  Narcotics Anonymous (NA), [facility] [address], [employer] [place]  2 meetings at this location   Special educational needs teacher         Address  Phone  Notes  [facility] [place],    [place]  [phone]   [facility]  [address], [place] [phone], [place], [place]   [place], [place] [phone] Admissions: 8am-3pm M-F  Incentives Substance [place] [address]    [place], [place] [phone]   [facility] [address] [place], [place], [place]   [facility] [address]  [place], [place]   [facility] - Intensive Outpatient [address] Kristeen Mans [address], [place], [place]   [facility] [place].,  [place], [place] [phone] or [phone]   [facility] Services (RTS) [address] [place], [place] Accepts Medicaid  [facility] [place] [address],  [place] [phone] Substance Abuse/Addiction Treatment   [facility] Organization         Address  Phone  Notes  [facility]  [phone]   Domenic Schwab, PhD [address] Arlis Porta [place], [place]   [phone] or [phone]   [facility] [place], [place] [phone]   [facility] [address] [place], [place] [phone] Insurance/Medicaid/sponsorship through [facility] and Families [address]                                    [place], [place] [phone] [place] [address], [place] [phone]    Dr. Adele Schilder  [phone]   [facility] Dept. 1) [address] [place] 2) [place] 3)  [place] [address], [place] [phone]  [phone]   [place] [phone])  [phone] or [phone] (After Hours)    ° ° ° °
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
## 2014-04-21 NOTE — ED Notes (Signed)
Pt in xray
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
## 2018-07-27 ENCOUNTER — Emergency Department (HOSPITAL_COMMUNITY)
Admission: EM | Admit: 2018-07-27 | Discharge: 2018-07-27 | Disposition: A | Discharge status: Home / Self Care | Payer: Self-pay | Attending: Emergency Medicine | Admitting: Emergency Medicine

## 2018-07-27 ENCOUNTER — Other Ambulatory Visit: Payer: Self-pay

## 2018-07-27 ENCOUNTER — Encounter (HOSPITAL_COMMUNITY): Payer: Self-pay | Admitting: Emergency Medicine

## 2018-07-27 ENCOUNTER — Emergency Department (HOSPITAL_COMMUNITY): Payer: Self-pay

## 2018-07-27 DIAGNOSIS — Y939 Activity, unspecified: Secondary | ICD-10-CM | POA: Insufficient documentation

## 2018-07-27 DIAGNOSIS — Z23 Encounter for immunization: Secondary | ICD-10-CM | POA: Insufficient documentation

## 2018-07-27 DIAGNOSIS — Y999 Unspecified external cause status: Secondary | ICD-10-CM | POA: Insufficient documentation

## 2018-07-27 DIAGNOSIS — Z79899 Other long term (current) drug therapy: Secondary | ICD-10-CM | POA: Insufficient documentation

## 2018-07-27 DIAGNOSIS — Y929 Unspecified place or not applicable: Secondary | ICD-10-CM | POA: Insufficient documentation

## 2018-07-27 DIAGNOSIS — F17210 Nicotine dependence, cigarettes, uncomplicated: Secondary | ICD-10-CM | POA: Insufficient documentation

## 2018-07-27 DIAGNOSIS — Z7982 Long term (current) use of aspirin: Secondary | ICD-10-CM | POA: Insufficient documentation

## 2018-07-27 DIAGNOSIS — W19XXXA Unspecified fall, initial encounter: Secondary | ICD-10-CM | POA: Insufficient documentation

## 2018-07-27 DIAGNOSIS — S61411A Laceration without foreign body of right hand, initial encounter: Secondary | ICD-10-CM | POA: Insufficient documentation

## 2018-07-27 MED ORDER — LIDOCAINE HCL 2 % IJ SOLN
20.0000 mL | Freq: Once | INTRAMUSCULAR | Status: AC
  Administered 2018-07-27: 12:00:00 400 mg via INTRADERMAL
  Filled 2018-07-27: qty 20

## 2018-07-27 MED ORDER — TETANUS-DIPHTH-ACELL PERTUSSIS 5-2.5-18.5 LF-MCG/0.5 IM SUSP
0.5000 mL | Freq: Once | INTRAMUSCULAR | Status: AC
  Administered 2018-07-27: 0.5 mL via INTRAMUSCULAR
  Filled 2018-07-27: qty 0.5
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
## 2018-07-27 NOTE — Discharge Instructions (Addendum)
Have the sutures out in 10 days.  Keep the area clean and dry.  You do not need to apply any Neosporin type ointments.  If you will be doing something where there is potential for to get dirty you will need to keep it covered.  Return for any signs of infection.
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
## 2018-07-27 NOTE — ED Notes (Signed)
Patient verbalizes understanding of discharge instructions . Opportunity for questions and answers were provided . Armband removed by staff ,Pt discharged from ED. W/C  offered at D/C  and Declined W/C at D/C and was escorted to lobby by RN.  

## 2018-10-08 ENCOUNTER — Encounter (HOSPITAL_COMMUNITY): Payer: Self-pay | Admitting: Emergency Medicine

## 2018-10-08 ENCOUNTER — Other Ambulatory Visit: Payer: Self-pay

## 2018-10-08 ENCOUNTER — Emergency Department (HOSPITAL_COMMUNITY)
Admission: EM | Admit: 2018-10-08 | Discharge: 2018-10-08 | Disposition: A | Discharge status: Home / Self Care | Payer: Self-pay | Attending: Emergency Medicine | Admitting: Emergency Medicine

## 2018-10-08 DIAGNOSIS — K0889 Other specified disorders of teeth and supporting structures: Secondary | ICD-10-CM | POA: Insufficient documentation

## 2018-10-08 DIAGNOSIS — F17210 Nicotine dependence, cigarettes, uncomplicated: Secondary | ICD-10-CM | POA: Insufficient documentation

## 2018-10-08 MED ORDER — PENICILLIN V POTASSIUM 250 MG PO TABS
500.0000 mg | ORAL_TABLET | Freq: Once | ORAL | Status: AC
  Administered 2018-10-08: 07:00:00 500 mg via ORAL
  Filled 2018-10-08: qty 2

## 2018-10-08 MED ORDER — PENICILLIN V POTASSIUM 500 MG PO TABS: 500.0000 mg | ORAL_TABLET | Freq: Four times a day (QID) | ORAL | 0 refills | Status: AC

## 2018-10-08 NOTE — ED Triage Notes (Signed)
Pt reports dental pain that got worse tonight.  Pt is unable to sleep from all the pain.  Tooth has been bad for a year.

## 2018-10-08 NOTE — ED Provider Notes (Signed)
[facility]   CSN: [id number] Arrival date & time: 10/08/18  [id number]     History   Chief Complaint Chief Complaint  Patient presents with  . Dental Pain    HPI Eduardo MealsBryan K Carnevale is a 43 y.o. male.  Patient presents to the emergency department with a dental complaint. Symptoms began 2 years ago. The patient has tried to alleviate pain with aspirin.  Pain rated as severe, characterized as throbbing in nature and located left upper rear molar. Patient denies fever, night sweats, chills, difficulty swallowing or opening mouth, SOB, nuchal rigidity or decreased ROM of neck.  Patient does not have a dentist and requests a resource guide at discharge.      HPI  History reviewed. No pertinent past medical history.  Patient Active Problem List   Diagnosis Date Noted  . Abdominal abscess 03/15/2013  . Pain, dental 03/15/2013    Past Surgical History:  Procedure Laterality Date  . LEG SURGERY          Home Medications    Prior to Admission medications   Medication Sig Start Date End Date Taking? Authorizing Provider  Aspirin-Salicylamide-Caffeine (BC HEADACHE POWDER PO) Take 1 packet by mouth 3 (three) times daily as needed (pain).    [provider]  doxycycline (VIBRAMYCIN) 100 MG capsule Take 1 capsule (100 mg total) by mouth 2 (two) times daily. One po bid x 7 days Patient not taking: Reported on 04/21/2014 03/30/14   Fayrene Helperran, Bowie, PA-C  ibuprofen (ADVIL,MOTRIN) 800 MG tablet Take 1 tablet (800 mg total) by mouth 3 (three) times daily. 05/07/13   Cherrie DistanceSanford, Frances, PA-C  oxyCODONE-acetaminophen (PERCOCET/ROXICET) 5-325 MG per tablet Take 1 tablet by mouth every 8 (eight) hours as needed for moderate pain or severe pain. 04/21/14   Sciacca, Marissa, PA-C  penicillin v potassium (VEETID) 500 MG tablet Take 1 tablet (500 mg total) by mouth 3 (three) times daily. Patient not taking: Reported on 04/21/2014 05/07/13   Cherrie DistanceSanford, Frances,  PA-C    Family History No family history on file.  Social History Social History   Tobacco Use  . Smoking status: Current Every Day Smoker    Types: Cigarettes  Substance Use Topics  . Alcohol use: Yes  . Drug use: No     Allergies   Patient has no known allergies.   Review of Systems Review of Systems  Constitutional: Negative for chills and fever.  HENT: Positive for dental problem. Negative for drooling.   Neurological: Negative for speech difficulty.  Psychiatric/Behavioral: Positive for sleep disturbance.     Physical Exam Updated Vital Signs BP (!) 167/119 (BP Location: Right Arm)   Pulse 80   Temp 98.3 F (36.8 C) (Oral)   Resp 20   Ht 6\' 4"  (1.93 m)   Wt 92.5 kg   SpO2 97%   BMI 24.83 kg/m   Physical Exam HENT:     Mouth/Throat:     Physical Exam  Constitutional: Pt appears well-developed and well-nourished.  HENT:  Head: Normocephalic.  Right Ear: Tympanic membrane, external ear and ear canal normal.  Left Ear: Tympanic membrane, external ear and ear canal normal.  Nose: Nose normal. Right sinus exhibits no maxillary sinus tenderness and no frontal sinus tenderness. Left sinus exhibits no maxillary sinus tenderness and no frontal sinus tenderness.  Mouth/Throat: Uvula is midline, oropharynx is clear and moist and mucous membranes are normal. No oral lesions. No uvula swelling or lacerations. No oropharyngeal exudate,  posterior oropharyngeal edema, posterior oropharyngeal erythema or tonsillar abscesses.  Poor dentition No gingival swelling, fluctuance or induration No gross abscess  No sublingual edema, tenderness to palpation, or sign of Ludwig's angina, or deep space infection  Eyes: Conjunctivae are normal. Pupils are equal, round, and reactive to light. Right eye exhibits no discharge. Left eye exhibits no discharge.  Neck: Normal range of motion. Neck supple.  No stridor Handling secretions without difficulty No nuchal rigidity No  cervical lymphadenopathy Cardiovascular: Normal rate, regular rhythm and normal heart sounds.   Pulmonary/Chest: Effort normal. No respiratory distress.  Equal chest rise  Abdominal: Soft. Bowel sounds are normal. Pt exhibits no distension. There is no tenderness.  Lymphadenopathy: Pt has no cervical adenopathy.  Neurological: Pt is alert and oriented x 4  Skin: Skin is warm and dry.  Psychiatric: Pt has a normal mood and affect.  Nursing note and vitals reviewed.   ED Treatments / Results  Labs (all labs ordered are listed, but only abnormal results are displayed) Labs Reviewed - No data to display  EKG    Radiology No results found.  Procedures Procedures (including critical care time)  Medications Ordered in ED Medications  penicillin v potassium (VEETID) tablet 500 mg (has no administration in time range)     Initial Impression / Assessment and Plan / ED Course  I have reviewed the triage vital signs and the nursing notes.  Pertinent labs & imaging results that were available during my care of the patient were reviewed by me and considered in my medical decision making (see chart for details).        Patient with dentalgia.  No abscess requiring immediate incision and drainage.  Exam not concerning for Ludwig's angina or pharyngeal abscess.  Will treat with penicillin. Pt instructed to follow-up with dentist.  Discussed return precautions. Pt safe for discharge.   Final Clinical Impressions(s) / ED Diagnoses   Final diagnoses:  Pain, dental    ED Discharge Orders         Ordered    penicillin v potassium (VEETID) 500 MG tablet  4 times daily     10/08/18 0631           Montine Circle, PA-C 10/08/18 0631    Palumbo, April, MD 10/08/18 [phone]

## 2020-12-25 IMAGING — DX RIGHT HAND - COMPLETE 3+ VIEW
3 series · 3 of 3 positions shown · non-contrast
Comparison: None.

CLINICAL DATA: Fall, hand injury with laceration.

EXAM:
RIGHT HAND - COMPLETE 3+ VIEW

[x hand pa right]
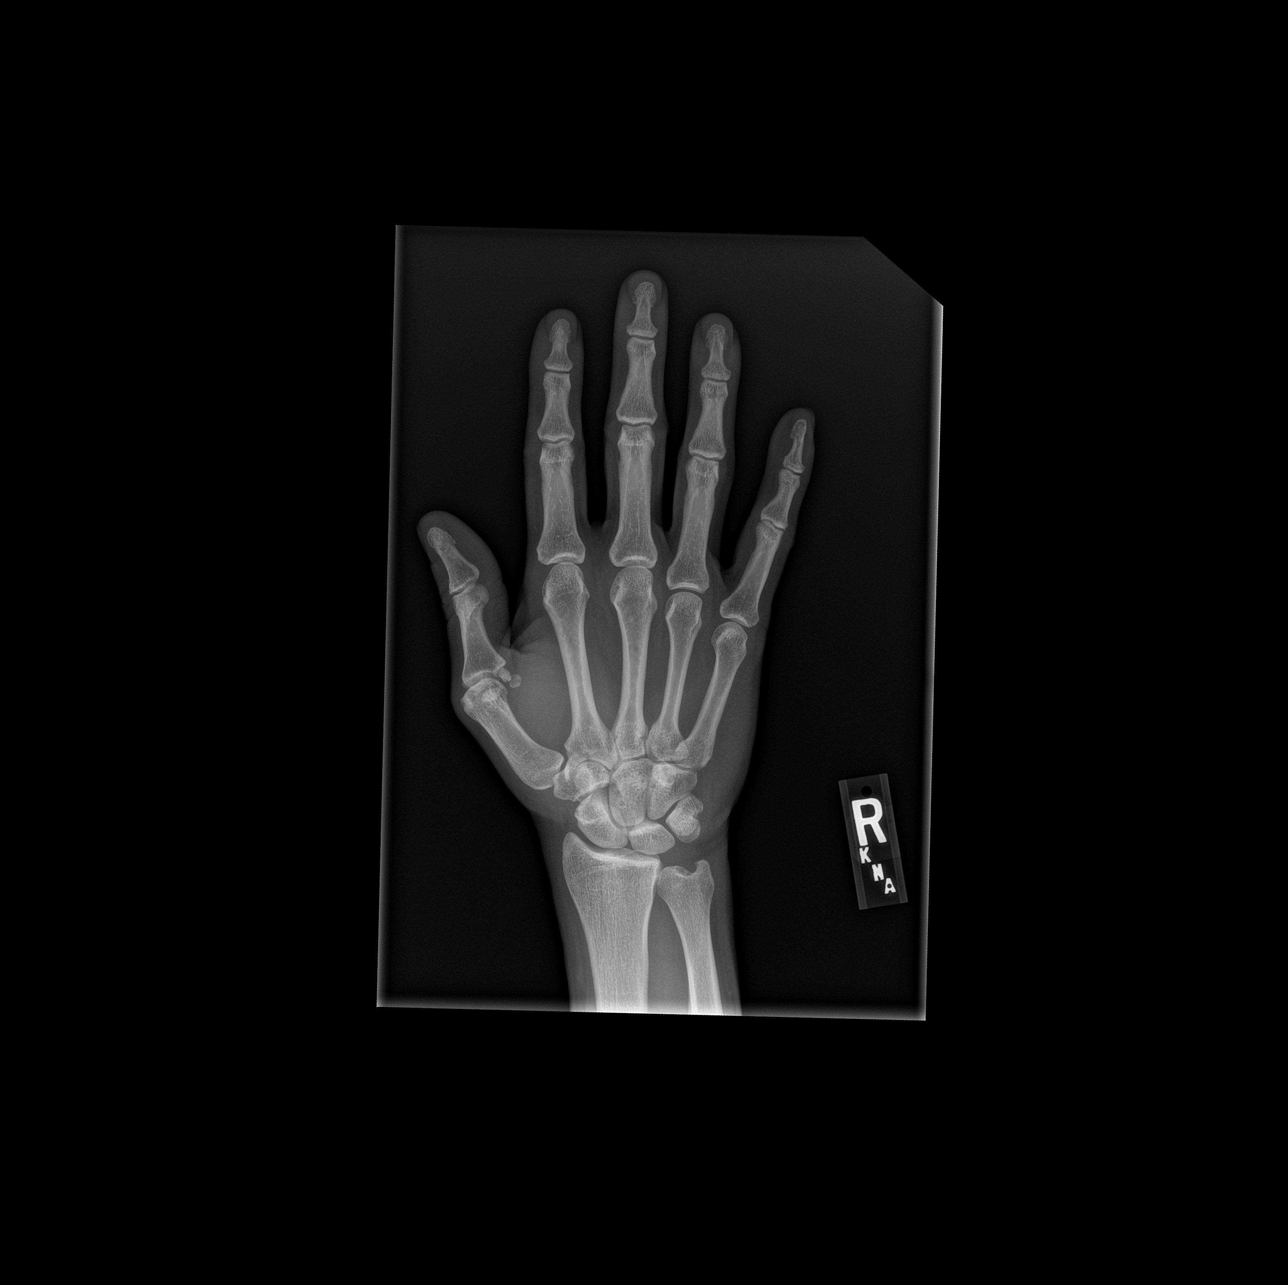

[x hand obl right]
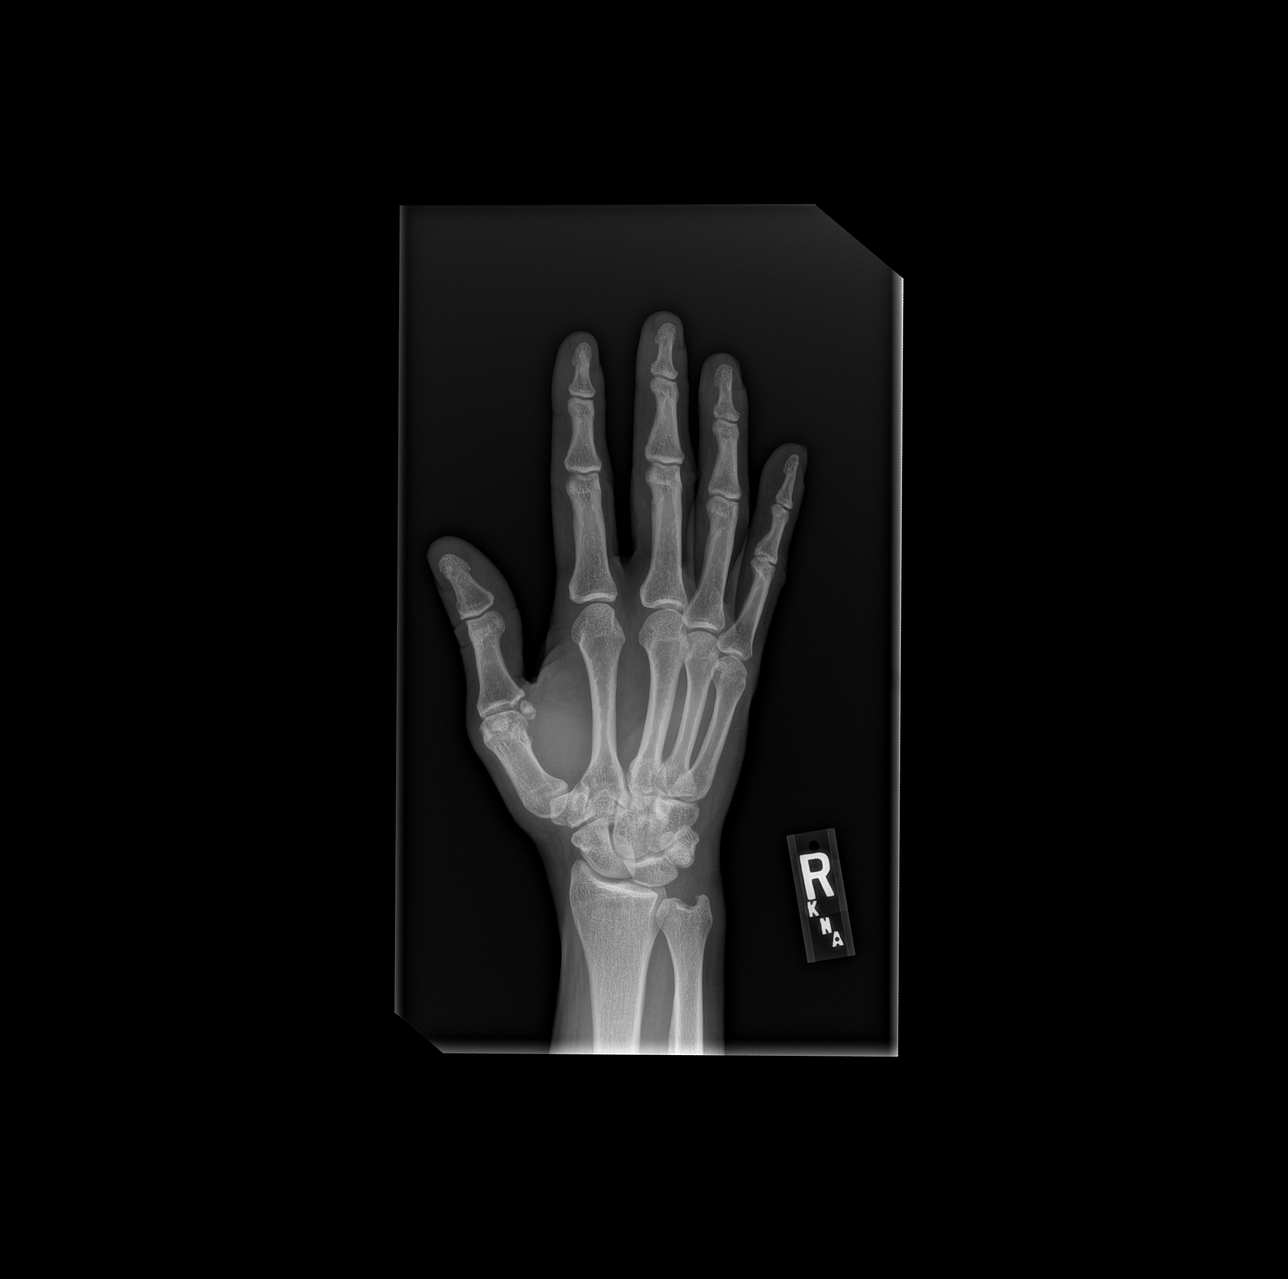

[x hand lat right]
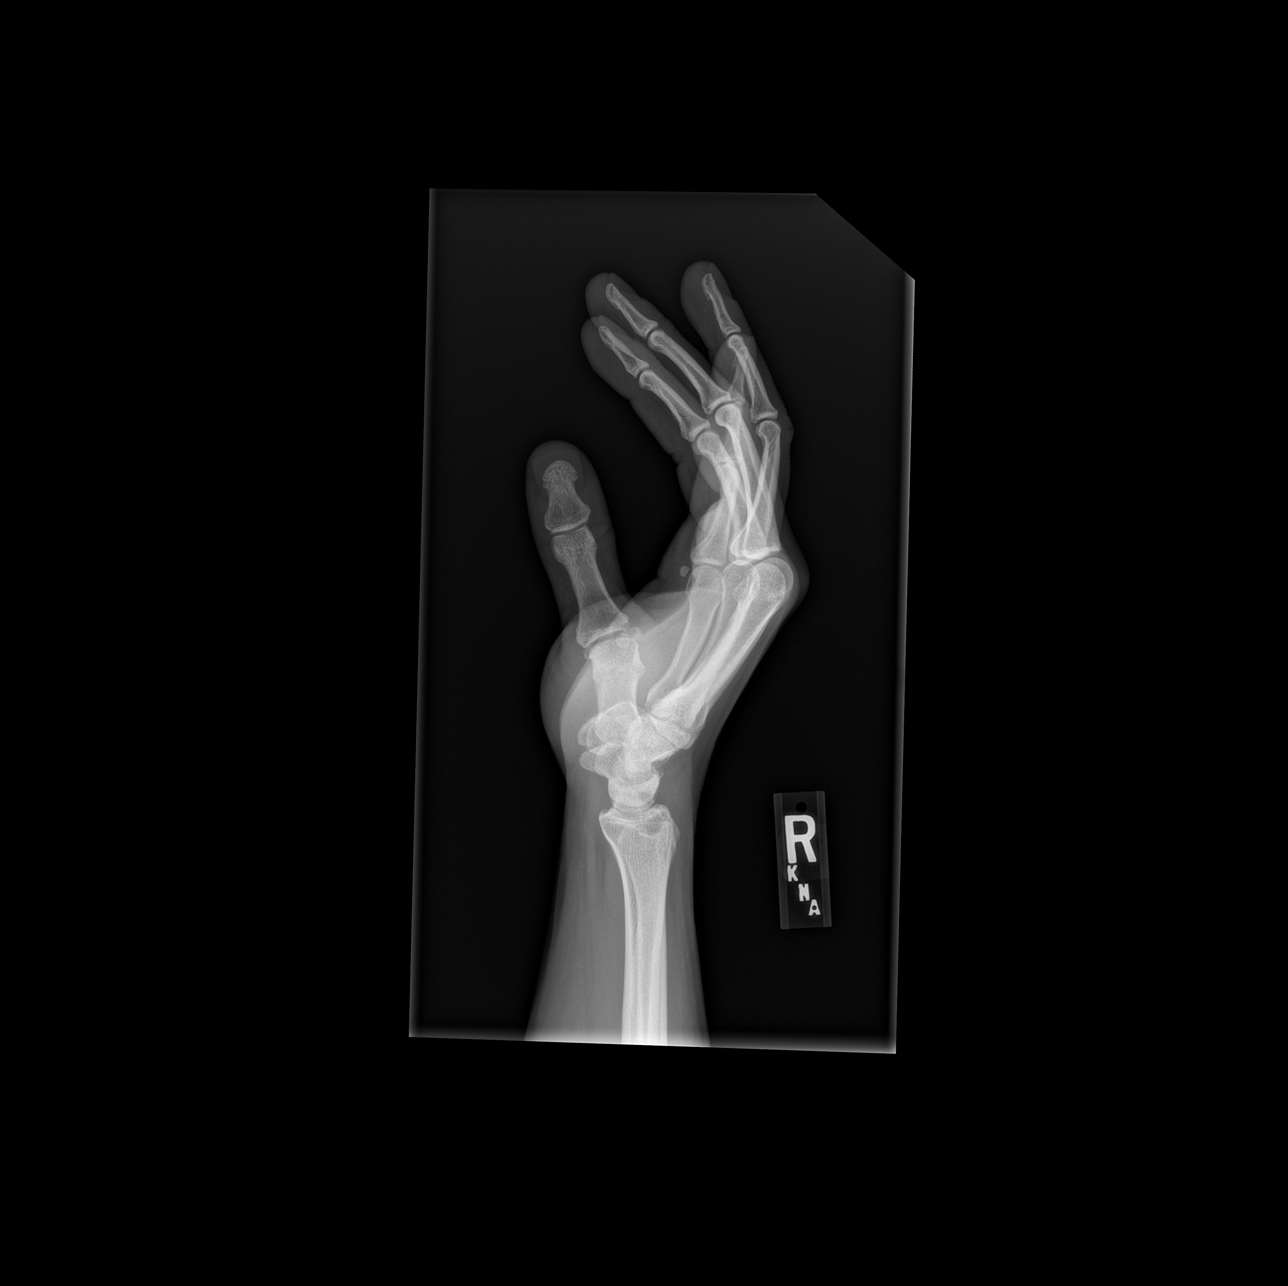

[3 of 3 positions shown; findings below may reference images not displayed]

FINDINGS: There is no evidence of fracture or dislocation. There is no
evidence of arthropathy or other focal bone abnormality. Soft
tissues are unremarkable. No foreign body appreciated in the soft
tissues.
IMPRESSION: 1. Negative.
2. No radiopaque foreign body.

## 2022-12-23 ENCOUNTER — Other Ambulatory Visit: Payer: Self-pay

## 2022-12-23 ENCOUNTER — Emergency Department (HOSPITAL_COMMUNITY)
Admission: EM | Admit: 2022-12-23 | Discharge: 2022-12-23 | Disposition: A | Discharge status: Home / Self Care | Payer: Self-pay | Attending: Student | Admitting: Student

## 2022-12-23 DIAGNOSIS — D72829 Elevated white blood cell count, unspecified: Secondary | ICD-10-CM | POA: Insufficient documentation

## 2022-12-23 DIAGNOSIS — R000 Tachycardia, unspecified: Secondary | ICD-10-CM | POA: Insufficient documentation

## 2022-12-23 DIAGNOSIS — E860 Dehydration: Secondary | ICD-10-CM

## 2022-12-23 DIAGNOSIS — F17210 Nicotine dependence, cigarettes, uncomplicated: Secondary | ICD-10-CM | POA: Insufficient documentation

## 2022-12-23 DIAGNOSIS — F1510 Other stimulant abuse, uncomplicated: Secondary | ICD-10-CM | POA: Insufficient documentation

## 2022-12-23 DIAGNOSIS — F419 Anxiety disorder, unspecified: Secondary | ICD-10-CM | POA: Insufficient documentation

## 2022-12-23 LAB — COMPREHENSIVE METABOLIC PANEL
ALT: 22 U/L (ref 0–44)
AST: 21 U/L (ref 15–41)
Albumin: 4.4 g/dL (ref 3.5–5.0)
Alkaline Phosphatase: 94 U/L (ref 38–126)
Anion gap: 18 — ABNORMAL HIGH (ref 5–15)
BUN: 24 mg/dL — ABNORMAL HIGH (ref 6–20)
CO2: 19 mmol/L — ABNORMAL LOW (ref 22–32)
Calcium: 9.6 mg/dL (ref 8.9–10.3)
Chloride: 98 mmol/L (ref 98–111)
Creatinine, Ser: 1.67 mg/dL — ABNORMAL HIGH (ref 0.61–1.24)
GFR, Estimated: 51 mL/min — ABNORMAL LOW (ref >60)
Glucose, Bld: 140 mg/dL — ABNORMAL HIGH (ref 70–99)
Potassium: 3.5 mmol/L (ref 3.5–5.1)
Sodium: 135 mmol/L (ref 135–145)
Total Bilirubin: 1.4 mg/dL — ABNORMAL HIGH (ref 0.3–1.2)
Total Protein: 8 g/dL (ref 6.5–8.1)

## 2022-12-23 LAB — CBC
HCT: 57.1 % — ABNORMAL HIGH (ref 39.0–52.0)
Hemoglobin: 19.9 g/dL — ABNORMAL HIGH (ref 13.0–17.0)
MCH: 29.5 pg (ref 26.0–34.0)
MCHC: 34.9 g/dL (ref 30.0–36.0)
MCV: 84.6 fL (ref 80.0–100.0)
Platelets: 247 10*3/uL (ref 150–400)
RBC: 6.75 MIL/uL — ABNORMAL HIGH (ref 4.22–5.81)
RDW: 13.6 % (ref 11.5–15.5)
WBC: 13 10*3/uL — ABNORMAL HIGH (ref 4.0–10.5)
nRBC: 0 % (ref 0.0–0.2)

## 2022-12-23 LAB — ETHANOL: Alcohol, Ethyl (B): <10 mg/dL (ref <10)

## 2022-12-23 MED ORDER — LORAZEPAM 1 MG PO TABS
1.0000 mg | ORAL_TABLET | Freq: Once | ORAL | Status: AC
  Administered 2022-12-23: 1 mg via ORAL
  Filled 2022-12-23: qty 1

## 2022-12-23 NOTE — ED Provider Notes (Signed)
[place] EMERGENCY DEPARTMENT AT [facility] Provider Note  CSN: [id number] Arrival date & time: 12/23/22 2595  Chief Complaint(s) Anxiety  HPI Eduardo Mcdowell is a 47 y.o. male who presents emergency room for evaluation of anxiety, insomnia and paranoia.  Patient states that 3 days ago he snorted methamphetamine and stayed up all night.  He then snorted 2 additional lines of methamphetamine and has not slept in last 3 days.  He states that he is feeling very anxious and sees people "everywhere he goes".  Patient arrives tachycardic and states he feels like he needs to "chill out".  Patient is calm, alert and oriented answering all questions appropriately.  Not currently reacting to internal stimuli.  Denies SI, HI.  Denies chest pain, shortness of breath, Donnell pain, nausea, vomiting or other systemic symptoms.   Past Medical History No past medical history on file. Patient Active Problem List   Diagnosis Date Noted   Abdominal abscess 03/15/2013   Pain, dental 03/15/2013   Home Medication(s) Prior to Admission medications   Medication Sig Start Date End Date Taking? Authorizing Provider  Aspirin-Salicylamide-Caffeine (BC HEADACHE POWDER PO) Take 1 packet by mouth 3 (three) times daily as needed (pain).    [provider]  doxycycline (VIBRAMYCIN) 100 MG capsule Take 1 capsule (100 mg total) by mouth 2 (two) times daily. One po bid x 7 days Patient not taking: Reported on 04/21/2014 03/30/14   Fayrene Helper, PA-C  ibuprofen (ADVIL,MOTRIN) 800 MG tablet Take 1 tablet (800 mg total) by mouth 3 (three) times daily. 05/07/13   Cherrie Distance, PA-C  oxyCODONE-acetaminophen (PERCOCET/ROXICET) 5-325 MG per tablet Take 1 tablet by mouth every 8 (eight) hours as needed for moderate pain or severe pain. 04/21/14   Sciacca, Marissa, PA-C  penicillin v potassium (VEETID) 500 MG tablet Take 1 tablet (500 mg total) by mouth 4 (four) times daily. 10/08/18   Felipa Furnace                                                                                                                                     Past Surgical History Past Surgical History:  Procedure Laterality Date   LEG SURGERY     Family History No family history on file.  Social History Social History   Tobacco Use   Smoking status: Every Day    Types: Cigarettes  Substance Use Topics   Alcohol use: Yes   Drug use: No   Allergies Patient has no known allergies.  Review of Systems Review of Systems  Psychiatric/Behavioral:  The patient is nervous/anxious.     Physical Exam Vital Signs  I have reviewed the triage vital signs BP 118/78 (BP Location: Right Arm)   Pulse (!) 113   Temp 97.9 F (36.6 C) (Oral)   Resp 16   Ht 6\' 4"  (1.93 m)   Wt 98 kg   SpO2 94%  BMI 26.29 kg/m   Physical Exam Constitutional:      General: He is not in acute distress.    Appearance: Normal appearance.  HENT:     Head: Normocephalic and atraumatic.     Nose: No congestion or rhinorrhea.  Eyes:     General:        Right eye: No discharge.        Left eye: No discharge.     Extraocular Movements: Extraocular movements intact.     Pupils: Pupils are equal, round, and reactive to light.  Cardiovascular:     Rate and Rhythm: Normal rate and regular rhythm.     Heart sounds: No murmur heard. Pulmonary:     Effort: No respiratory distress.     Breath sounds: No wheezing or rales.  Abdominal:     General: There is no distension.     Tenderness: There is no abdominal tenderness.  Musculoskeletal:        General: Normal range of motion.     Cervical back: Normal range of motion.  Skin:    General: Skin is warm and dry.  Neurological:     General: No focal deficit present.     Mental Status: He is alert.     ED Results and Treatments Labs (all labs ordered are listed, but only abnormal results are displayed) Labs Reviewed  COMPREHENSIVE METABOLIC PANEL - Abnormal; Notable for the following  components:      Result Value   CO2 19 (*)    Glucose, Bld 140 (*)    BUN 24 (*)    Creatinine, Ser 1.67 (*)    Total Bilirubin 1.4 (*)    GFR, Estimated 51 (*)    Anion gap 18 (*)    All other components within normal limits  CBC - Abnormal; Notable for the following components:   WBC 13.0 (*)    RBC 6.75 (*)    Hemoglobin 19.9 (*)    HCT 57.1 (*)    All other components within normal limits  ETHANOL  RAPID URINE DRUG SCREEN, HOSP PERFORMED                                                                                                                          Radiology No results found.  Pertinent labs & imaging results that were available during my care of the patient were reviewed by me and considered in my medical decision making (see MDM for details).  Medications Ordered in ED Medications  LORazepam (ATIVAN) tablet 1 mg (1 mg Oral Given 12/23/22 0735)  Procedures Procedures  (including critical care time)  Medical Decision Making / ED Course   This patient presents to the ED for concern of anxiety, insomnia, this involves an extensive number of treatment options, and is a complaint that carries with it a high risk of complications and morbidity.  The differential diagnosis includes methamphetamine use, panic attack, anxiety, psychosis, electrolyte abnormality, dehydration  MDM: Patient seen emergency room for evaluation of anxiety and paranoia in the setting of methamphetamine use.  Physical exam is largely unremarkable outside of a regular tachycardia.  Patient is calm and not displaying evidence of psychosis in the emergency department today.  Laboratory evaluation with a leukocytosis of 13, hemoglobin 19.9, BUN 24, creatinine 1.67.  Unfortunately do not have baseline labs to compare to as the last blood work in his chart is 47 years old.  I  did offer IV fluid resuscitation but patient would like to pursue oral fluid resuscitation if possible.  He received 1 dose of oral Ativan and is feeling significant improved.  Tachycardia improved from 140s to low 110s.  At this time, patient presentation consistent with methamphetamine use and I did counsel him on cessation of this.  With symptoms improved, patient does not meet inpatient criteria and he was discharged with outpatient follow-up.  I emphasized the importance of oral hydration of which she voiced understanding.  Patient given return precautions and discharged with outpatient follow-up   Additional history obtained:  -External records from outside source obtained and reviewed including: Chart review including previous notes, labs, imaging, consultation notes   Lab Tests: -I ordered, reviewed, and interpreted labs.   The pertinent results include:   Labs Reviewed  COMPREHENSIVE METABOLIC PANEL - Abnormal; Notable for the following components:      Result Value   CO2 19 (*)    Glucose, Bld 140 (*)    BUN 24 (*)    Creatinine, Ser 1.67 (*)    Total Bilirubin 1.4 (*)    GFR, Estimated 51 (*)    Anion gap 18 (*)    All other components within normal limits  CBC - Abnormal; Notable for the following components:   WBC 13.0 (*)    RBC 6.75 (*)    Hemoglobin 19.9 (*)    HCT 57.1 (*)    All other components within normal limits  ETHANOL  RAPID URINE DRUG SCREEN, HOSP PERFORMED       Medicines ordered and prescription drug management: Meds ordered this encounter  Medications   LORazepam (ATIVAN) tablet 1 mg    -I have reviewed the patients home medicines and have made adjustments as needed  Critical interventions none   Social Determinants of Health:  Factors impacting patients care include: methamphetamine use   Reevaluation: After the interventions noted above, I reevaluated the patient and found that they have :improved  Co morbidities that complicate the  patient evaluation No past medical history on file.    Dispostion: I considered admission for this patient, but at this time he does not meet inpatient criteria for admission and he is safe for discharge with outpatient follow-up     Final Clinical Impression(s) / ED Diagnoses Final diagnoses:  Anxiety  Methamphetamine abuse [facility])     @PCDICTATION @    Glendora Score, MD 12/23/22 [phone]

## 2022-12-23 NOTE — ED Notes (Signed)
Pt attempted to collect urine, unable at this time

## 2022-12-23 NOTE — ED Triage Notes (Signed)
Pt arrives to ED c/o anxiety x 2 days. Pt endorses snorting crystal meth and and unable to sleep for 3 days. Pt endorses possible hallucinations from lack of sleep

## 2023-03-23 ENCOUNTER — Other Ambulatory Visit: Payer: Self-pay

## 2023-03-23 ENCOUNTER — Encounter (HOSPITAL_COMMUNITY): Payer: Self-pay

## 2023-03-23 ENCOUNTER — Emergency Department (HOSPITAL_COMMUNITY)
Admission: EM | Admit: 2023-03-23 | Discharge: 2023-03-23 | Discharge status: Against Medical Advice | Payer: Self-pay | Attending: Emergency Medicine | Admitting: Emergency Medicine

## 2023-03-23 DIAGNOSIS — Z5321 Procedure and treatment not carried out due to patient leaving prior to being seen by health care provider: Secondary | ICD-10-CM | POA: Insufficient documentation

## 2023-03-23 DIAGNOSIS — F1590 Other stimulant use, unspecified, uncomplicated: Secondary | ICD-10-CM | POA: Insufficient documentation

## 2023-03-23 LAB — RAPID URINE DRUG SCREEN, HOSP PERFORMED
Amphetamines: POSITIVE — AB
Barbiturates: NOT DETECTED
Benzodiazepines: NOT DETECTED
Cocaine: NOT DETECTED
Opiates: NOT DETECTED
Tetrahydrocannabinol: NOT DETECTED

## 2023-03-23 LAB — CBC
HCT: 50.3 % (ref 39.0–52.0)
Hemoglobin: 17.8 g/dL — ABNORMAL HIGH (ref 13.0–17.0)
MCH: 30.1 pg (ref 26.0–34.0)
MCHC: 35.4 g/dL (ref 30.0–36.0)
MCV: 85 fL (ref 80.0–100.0)
Platelets: 228 10*3/uL (ref 150–400)
RBC: 5.92 MIL/uL — ABNORMAL HIGH (ref 4.22–5.81)
RDW: 13.2 % (ref 11.5–15.5)
WBC: 10.4 10*3/uL (ref 4.0–10.5)
nRBC: 0 % (ref 0.0–0.2)

## 2023-03-23 LAB — COMPREHENSIVE METABOLIC PANEL
ALT: 17 U/L (ref 0–44)
AST: 18 U/L (ref 15–41)
Albumin: 4.1 g/dL (ref 3.5–5.0)
Alkaline Phosphatase: 77 U/L (ref 38–126)
Anion gap: 11 (ref 5–15)
BUN: 14 mg/dL (ref 6–20)
CO2: 22 mmol/L (ref 22–32)
Calcium: 9.5 mg/dL (ref 8.9–10.3)
Chloride: 100 mmol/L (ref 98–111)
Creatinine, Ser: 1.12 mg/dL (ref 0.61–1.24)
GFR, Estimated: >60 mL/min (ref >60)
Glucose, Bld: 109 mg/dL — ABNORMAL HIGH (ref 70–99)
Potassium: 4 mmol/L (ref 3.5–5.1)
Sodium: 133 mmol/L — ABNORMAL LOW (ref 135–145)
Total Bilirubin: 1 mg/dL (ref <1.2)
Total Protein: 7.2 g/dL (ref 6.5–8.1)

## 2023-03-23 NOTE — ED Triage Notes (Signed)
Pt states " My neighbor had me snorting meth and now I just don't feel right. Been a long night and I need to be watched." Pt denies any pain.

## 2023-03-23 NOTE — ED Notes (Signed)
Patient called for room. No response  

## 2023-04-20 ENCOUNTER — Ambulatory Visit (HOSPITAL_COMMUNITY)
Admission: EM | Admit: 2023-04-20 | Discharge: 2023-04-20 | Disposition: A | Discharge status: Home / Self Care | Payer: No Payment, Other

## 2023-04-20 DIAGNOSIS — F32A Depression, unspecified: Secondary | ICD-10-CM

## 2023-04-20 NOTE — Discharge Instructions (Signed)
 Return to outpatient clinic to be seen by a provider for assessment and therapy. Resources and direction provided.

## 2023-04-20 NOTE — ED Provider Notes (Signed)
 Behavioral Health Urgent Care Medical Screening Exam  Patient Name: Eduardo Mcdowell MRN: [id number] Date of Evaluation: 04/20/23 Chief Complaint:   Diagnosis:  Final diagnoses:  None    History of Present illness: Eduardo Mcdowell is a 48 y.o. Caucasian male who presents voluntarily accompanied by his father, to [facility] for worsening depression and anxiety.  Patient has prior psychiatric history of anxiety, insomnia, and paranoia.  He presents this time for complaint of worsening depression and anxiety without SI, HI, or AVH.  During this evaluation, patient denies use of any substance like methamphetamine, cocaine, marijuana, however endorses drinking 18 packs of alcohol beer for 3 days.  Reports symptoms of depression to include decreased sleep, isolation, poor motivation, poor appetite, sad mood, and anhedonia.  Patient reports being seen at [facility] for previous drug use few years ago, which help greatly.  However he is unable to remember the year that he was helped at [facility].  He denies substance use currently, except for drinking alcohol.  Reported history of being in prison in 1999, for indecent leverage with a minor.  Further reports being incarcerated for 2 years and was released 2 years ago from prison for failure to register as a sex offender.  Patient reports, since released from incarceration he has not been able to find a job and remains unemployed until now. Reports above as his main stressors which lead to his anxiety and depression.  Added he is here to get help and talk with the provider for help with his depression and anxiety.  Reports highest education level is being dropped out of seventh grade.  He is currently living with his dad since he left prison.  Objective: Patient is seen and examined sitting up in a chair in the examination room.  He is alert, & oriented to person, place, time, and situation.  Chart reviewed and findings shared with the treatment team and consult  with attending psychiatrist.  Mood appears depressed and hopeless.  Speech is clear, coherent with normal volume and pattern.  Patient has fair eye contact with this provider.  Thought content and thought process coherent with some relevance.  He denies SI, HI, or AVH.  Patient does not meet the criteria for admission at this time however, resources provided for patient to return to outpatient clinic at [facility] on Monday, 04/23/2023 for assessment for medication management and therapy.   Flowsheet Row ED from 04/20/2023 in [facility] ED from 03/23/2023 in [facility] Emergency Department at [facility] ED from 12/23/2022 in [facility] Emergency Department at [facility]  C-SSRS RISK CATEGORY No Risk No Risk No Risk      Psychiatric Specialty Exam  Presentation  General Appearance:Disheveled; Casual  Eye Contact:Fair  Speech:Clear and Coherent; Normal Rate  Speech Volume:Normal  Handedness:Right  Mood and Affect  Mood: Anxious; Depressed  Affect: Depressed  Thought Process  Thought Processes: Linear  Descriptions of Associations:Intact  Orientation:Full (Time, Place and Person)  Thought Content:Logical    Hallucinations:None  Ideas of Reference:None  Suicidal Thoughts:No  Homicidal Thoughts:No  Sensorium  Memory: Immediate Fair; Recent Fair  Judgment: Fair  Insight: Fair  Art Therapist  Concentration: Fair  Attention Span: Fair  Recall: [employer] of Knowledge: Fair  Language: Fair  Psychomotor Activity  Psychomotor Activity: Normal  Assets  Assets: Communication Skills  Sleep  Sleep: Fair  Number of hours:  4  Physical Exam: Physical Exam Vitals and nursing note reviewed.  HENT:     Head: Normocephalic.     Nose: Nose normal.     Mouth/Throat:     Mouth: Mucous membranes are moist.  Eyes:     Extraocular Movements: Extraocular movements intact.  Cardiovascular:     Rate and  Rhythm: Normal rate.     Pulses: Normal pulses.  Pulmonary:     Effort: Pulmonary effort is normal.  Abdominal:     Comments: Deferred  Genitourinary:    Comments: Deferred Musculoskeletal:        General: Normal range of motion.     Cervical back: Normal range of motion.  Skin:    General: Skin is warm.  Neurological:     General: No focal deficit present.     Mental Status: He is alert and oriented to person, place, and time.  Psychiatric:        Mood and Affect: Mood normal.        Behavior: Behavior normal.    Review of Systems  Constitutional:  Negative for chills and fever.  HENT:  Negative for sore throat.   Eyes:  Negative for blurred vision.  Respiratory:  Negative for cough, sputum production, shortness of breath and wheezing.   Cardiovascular:  Negative for chest pain.  Gastrointestinal:  Negative for abdominal pain, diarrhea, heartburn, nausea and vomiting.  Genitourinary:  Negative for dysuria, frequency and urgency.  Musculoskeletal: Negative.   Skin:  Negative for itching and rash.  Neurological:  Negative for dizziness, tingling and headaches.  Endo/Heme/Allergies:        See allergy listing  Psychiatric/Behavioral:  Positive for depression. The patient is nervous/anxious.    Blood pressure 136/79, pulse 80, temperature 98.9 F (37.2 C), temperature source Oral, resp. rate 20, SpO2 100%. There is no height or weight on file to calculate BMI.  Musculoskeletal: Strength & Muscle Tone: within normal limits Gait & Station: normal Patient leans: N/A  [facility] MSE Discharge Disposition for Follow up and Recommendations: Based on my evaluation the patient does not appear to have an emergency medical condition and can be discharged with resources and follow up care in outpatient services for Medication Management, Individual Therapy, and Group Therapy   Takumi Din C Josecarlos Harriott, FNP 04/20/2023, 12:33 PM
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
## 2023-04-20 NOTE — ED Notes (Signed)
 Pt discharged

## 2023-04-24 ENCOUNTER — Encounter (HOSPITAL_COMMUNITY): Payer: Self-pay | Admitting: Physician Assistant

## 2023-04-24 ENCOUNTER — Ambulatory Visit (INDEPENDENT_AMBULATORY_CARE_PROVIDER_SITE_OTHER): Payer: No Payment, Other | Admitting: Physician Assistant

## 2023-04-24 VITALS — BP 139/102 | HR 67 | Temp 98.0°F | Ht 76.0 in | Wt 203.2 lb

## 2023-04-24 DIAGNOSIS — F411 Generalized anxiety disorder: Secondary | ICD-10-CM | POA: Diagnosis not present

## 2023-04-24 DIAGNOSIS — F331 Major depressive disorder, recurrent, moderate: Secondary | ICD-10-CM | POA: Diagnosis not present

## 2023-04-24 DIAGNOSIS — F5105 Insomnia due to other mental disorder: Secondary | ICD-10-CM | POA: Diagnosis not present

## 2023-04-24 DIAGNOSIS — Z758 Other problems related to medical facilities and other health care: Secondary | ICD-10-CM

## 2023-04-24 DIAGNOSIS — F99 Mental disorder, not otherwise specified: Secondary | ICD-10-CM

## 2023-04-24 MED ORDER — TRAZODONE HCL 50 MG PO TABS: 50.0000 mg | ORAL_TABLET | Freq: Every day | ORAL | 1 refills | Status: DC

## 2023-04-24 MED ORDER — SERTRALINE HCL 50 MG PO TABS: ORAL_TABLET | ORAL | 1 refills | Status: DC
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
## 2023-06-13 ENCOUNTER — Ambulatory Visit (INDEPENDENT_AMBULATORY_CARE_PROVIDER_SITE_OTHER): Payer: No Payment, Other | Admitting: Physician Assistant

## 2023-06-13 DIAGNOSIS — F411 Generalized anxiety disorder: Secondary | ICD-10-CM

## 2023-06-13 DIAGNOSIS — F99 Mental disorder, not otherwise specified: Secondary | ICD-10-CM | POA: Diagnosis not present

## 2023-06-13 DIAGNOSIS — F5105 Insomnia due to other mental disorder: Secondary | ICD-10-CM

## 2023-06-13 DIAGNOSIS — F331 Major depressive disorder, recurrent, moderate: Secondary | ICD-10-CM | POA: Diagnosis not present

## 2023-06-13 MED ORDER — BUSPIRONE HCL 7.5 MG PO TABS
7.5000 mg | ORAL_TABLET | Freq: Two times a day (BID) | ORAL | 1 refills | Status: DC
Start: 2023-06-13 — End: 2023-07-25

## 2023-06-13 MED ORDER — SERTRALINE HCL 50 MG PO TABS: 150.0000 mg | ORAL_TABLET | Freq: Every day | ORAL | 1 refills | Status: DC

## 2023-06-13 MED ORDER — TRAZODONE HCL 50 MG PO TABS: 50.0000 mg | ORAL_TABLET | Freq: Every day | ORAL | 1 refills | Status: DC

## 2023-06-14 ENCOUNTER — Encounter (HOSPITAL_COMMUNITY): Payer: Self-pay

## 2023-06-14 ENCOUNTER — Ambulatory Visit (HOSPITAL_COMMUNITY): Payer: No Payment, Other | Admitting: Clinical

## 2023-06-14 ENCOUNTER — Encounter (HOSPITAL_COMMUNITY): Payer: Self-pay | Admitting: Physician Assistant

## 2023-06-14 DIAGNOSIS — F331 Major depressive disorder, recurrent, moderate: Secondary | ICD-10-CM | POA: Diagnosis not present
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
## 2023-06-14 NOTE — Progress Notes (Signed)
 BH MD/PA/NP OP Progress Note  06/13/2023 8:00 PM Eduardo Mcdowell  MRN:  [id number]  Chief Complaint:  Chief Complaint  Patient presents with   Follow-up   Medication Management   HPI:   Eduardo Mcdowell. Catalfamo is a 48 year old male with a past psychiatric history significant for major depressive disorder (recurrent, moderate episode), generalized anxiety disorder, and insomnia who presents to [facility] for follow-up and medication management.  Patient is currently being managed on the following psychiatric medications:  Sertraline 100 mg daily Trazodone 50 mg at bedtime  Since the last encounter, patient endorses some improvement in his symptoms but still experiences lack of motivation.  Patient also reports that he is constantly in his thoughts.  He endorses periods of time of wanting to self isolate.  He endorses mood swings where he is happy and wanting to get work done but once he starts working, he wants to go back home and self-isolate. Patient admits to not taking trazodone out of fear of oversleeping.  He still continues to endorse issues with getting out of bed.  A PHQ-9 screen was performed with the patient scoring a 20.  A GAD-7 screen was also performed with the patient scoring a 21.  Patient is alert and oriented x 4, calm, cooperative, and fully engaged in conversation during the encounter.  Patient endorses okay mood.  Patient exhibits depressed mood with congruent affect.  Patient denies suicidal or homicidal ideations.  He further denies auditory or visual hallucinations and does not appear to be responding to internal/external stimuli.  Patient endorses fair sleep stating that he constantly naps throughout the day.  Patient endorses fluctuating appetite but states that he occasionally eats all day.  Patient denies alcohol consumption or illicit drug use.  Patient endorses tobacco use and smokes on average a pack per day.  Visit Diagnosis:     ICD-10-CM   1. Moderate episode of recurrent major depressive disorder (HCC)  F33.1 sertraline (ZOLOFT) 50 MG tablet    2. Generalized anxiety disorder  F41.1 busPIRone (BUSPAR) 7.5 MG tablet    sertraline (ZOLOFT) 50 MG tablet    3. Insomnia due to other mental disorder  F51.05 traZODone (DESYREL) 50 MG tablet   F99       Past Psychiatric History:  Patient endorses a past psychiatric history significant for depression and anxiety. - Patient has been diagnosed with major depressive disorder and generalized anxiety disorder   Patient denies a past history of hospitalization due to mental health   Patient denies a past history of suicide attempt   Patient denies a past history of homicide attempt  Past Medical History: History reviewed. No pertinent past medical history.  Past Surgical History:  Procedure Laterality Date   LEG SURGERY      Family Psychiatric History:  Patient reports that his mother's side of the family have a history of schizophrenia.  Patient reports that alcohol abuse runs in the family.   Family history of suicide attempt: Patient denies Family history of homicide attempt: Patient denies Family history of substance abuse: Patient endorses a family history of alcohol abuse  Family History: History reviewed. No pertinent family history.  Social History:  Social History   Socioeconomic History   Marital status: Married    Spouse name: Not on file   Number of children: Not on file   Years of education: Not on file   Highest education level: Not on file  Occupational History   Not  on file  Tobacco Use   Smoking status: Every Day    Types: Cigarettes   Smokeless tobacco: Not on file  Substance and Sexual Activity   Alcohol use: Yes   Drug use: Yes    Types: Methamphetamines   Sexual activity: Not on file  Other Topics Concern   Not on file  Social History Narrative   Not on file   Social Drivers of Health   Financial Resource Strain: Not on  file  Food Insecurity: Not on file  Transportation Needs: Not on file  Physical Activity: Not on file  Stress: Not on file  Social Connections: Not on file    Allergies: No Known Allergies  Metabolic Disorder Labs: No results found for: "HGBA1C", "MPG" No results found for: "PROLACTIN" No results found for: "CHOL", "TRIG", "HDL", "CHOLHDL", "VLDL", "LDLCALC" No results found for: "TSH"  Therapeutic Level Labs: No results found for: "LITHIUM" No results found for: "VALPROATE" No results found for: "CBMZ"  Current Medications: Current Outpatient Medications  Medication Sig Dispense Refill   busPIRone (BUSPAR) 7.5 MG tablet Take 1 tablet (7.5 mg total) by mouth 2 (two) times daily. 60 tablet 1   Aspirin-Salicylamide-Caffeine (BC HEADACHE POWDER PO) Take 1 packet by mouth 3 (three) times daily as needed (pain).     doxycycline (VIBRAMYCIN) 100 MG capsule Take 1 capsule (100 mg total) by mouth 2 (two) times daily. One po bid x 7 days (Patient not taking: Reported on 04/21/2014) 14 capsule 0   ibuprofen (ADVIL,MOTRIN) 800 MG tablet Take 1 tablet (800 mg total) by mouth 3 (three) times daily. 21 tablet 0   oxyCODONE-acetaminophen (PERCOCET/ROXICET) 5-325 MG per tablet Take 1 tablet by mouth every 8 (eight) hours as needed for moderate pain or severe pain. 7 tablet 0   penicillin v potassium (VEETID) 500 MG tablet Take 1 tablet (500 mg total) by mouth 4 (four) times daily. 40 tablet 0   sertraline (ZOLOFT) 50 MG tablet Take 3 tablets (150 mg total) by mouth daily. 90 tablet 1   traZODone (DESYREL) 50 MG tablet Take 1 tablet (50 mg total) by mouth at bedtime. 30 tablet 1   No current facility-administered medications for this visit.     Musculoskeletal: Strength & Muscle Tone: within normal limits Gait & Station: normal Patient leans: N/A  Psychiatric Specialty Exam: Review of Systems  Psychiatric/Behavioral:  Positive for dysphoric mood and sleep disturbance. Negative for decreased  concentration, hallucinations, self-injury and suicidal ideas. The patient is nervous/anxious. The patient is not hyperactive.     Blood pressure 139/86, pulse 64, temperature 98 F (36.7 C), temperature source Oral, height 6\' 4"  (1.93 m), weight 206 lb 6.4 oz (93.6 kg), SpO2 97%.Body mass index is 25.12 kg/m.  General Appearance: Casual  Eye Contact:  Good  Speech:  Clear and Coherent and Normal Rate  Volume:  Normal  Mood:  Anxious and Depressed  Affect:  Congruent  Thought Process:  Coherent, Goal Directed, and Descriptions of Associations: Intact  Orientation:  Full (Time, Place, and Person)  Thought Content: WDL   Suicidal Thoughts:  No  Homicidal Thoughts:  No  Memory:  Immediate;   Good Recent;   Fair Remote;   Fair  Judgement:  Good  Insight:  Good  Psychomotor Activity:  Normal  Concentration:  Concentration: Good and Attention Span: Good  Recall:  Good  Fund of Knowledge: Fair  Language: Good  Akathisia:  No  Handed:  Right  AIMS (if indicated): not done  Assets:  Communication Skills Desire for Improvement Housing Social Support  ADL's:  Intact  Cognition: WNL  Sleep:  Fair   Screenings: GAD-7    Flowsheet Row Clinical Support from 06/13/2023 in [facility] Office Visit from 04/24/2023 in [facility]  Total GAD-7 Score 21 21      PHQ2-9    Flowsheet Row Clinical Support from 06/13/2023 in [facility] Office Visit from 04/24/2023 in [place]  PHQ-2 Total Score 5 5  PHQ-9 Total Score 20 22      Flowsheet Row Clinical Support from 06/13/2023 in [facility] Office Visit from 04/24/2023 in [facility] ED from 04/20/2023 in [facility]  C-SSRS RISK CATEGORY No Risk No Risk No Risk        Assessment and Plan:   Karston Hyland. Digirolamo is a 48 year old male with a past  psychiatric history significant for major depressive disorder (recurrent, moderate episode), generalized anxiety disorder, and insomnia who presents to [facility] for follow-up and medication management.  Patient presents to encounter continuing to experience depressive symptoms.  In addition to depressive symptoms, patient endorses issues with sleep.  Patient admits to not taking his trazodone out of fear of oversleeping.  Patient was encouraged to take trazodone for the management of his sleep.  Provider also recommended increasing patient's sertraline dosage from 100 mg to 150 mg for the management of his depressive symptoms and anxiety.  Lastly, provider recommended patient be placed on buspirone 7.5 mg 2 times daily for the management of his anxiety.  Patient was agreeable to recommendations.  Patient's medications to be e-prescribed through pharmacy of choice.  Collaboration of Care: Collaboration of Care: Medication Management AEB provider managing patient's psychiatric medications, Psychiatrist AEB patient being followed by mental health provider at that facility, and Referral or follow-up with counselor/therapist AEB patient being seen by a licensed clinical social worker at this facility  Patient/Guardian was advised Release of Information must be obtained prior to any record release in order to collaborate their care with an outside provider. Patient/Guardian was advised if they have not already done so to contact the registration department to sign all necessary forms in order for [place] to release information regarding their care.   Consent: Patient/Guardian gives verbal consent for treatment and assignment of benefits for services provided during this visit. Patient/Guardian expressed understanding and agreed to proceed.   1. Moderate episode of recurrent major depressive disorder (HCC)  - sertraline (ZOLOFT) 50 MG tablet; Take 3 tablets (150 mg total) by  mouth daily.  Dispense: 90 tablet; Refill: 1  2. Generalized anxiety disorder  - busPIRone (BUSPAR) 7.5 MG tablet; Take 1 tablet (7.5 mg total) by mouth 2 (two) times daily.  Dispense: 60 tablet; Refill: 1 - sertraline (ZOLOFT) 50 MG tablet; Take 3 tablets (150 mg total) by mouth daily.  Dispense: 90 tablet; Refill: 1  3. Insomnia due to other mental disorder  - traZODone (DESYREL) 50 MG tablet; Take 1 tablet (50 mg total) by mouth at bedtime.  Dispense: 30 tablet; Refill: 1  Patient to follow up in 6 weeks Provider spent a total of 23 minutes with the patient/reviewing patient's chart  Meta Hatchet, PA 06/13/2023, 8:00 PM

## 2023-07-24 ENCOUNTER — Ambulatory Visit (INDEPENDENT_AMBULATORY_CARE_PROVIDER_SITE_OTHER): Payer: No Payment, Other | Admitting: Clinical

## 2023-07-24 DIAGNOSIS — F331 Major depressive disorder, recurrent, moderate: Secondary | ICD-10-CM | POA: Diagnosis not present

## 2023-07-24 NOTE — Progress Notes (Unsigned)
 THERAPIST PROGRESS NOTE Virtual Visit via Video Note  I connected with Wynona Meals on 07/24/2023 at 11:00 AM EDT by a video enabled telemedicine application and verified that I am speaking with the correct person using two identifiers.  Location: Patient: home Provider: office   I discussed the limitations of evaluation and management by telemedicine and the availability of in person appointments. The patient expressed understanding and agreed to proceed.   Follow Up Instructions: I discussed the assessment and treatment plan with the patient. The patient was provided an opportunity to ask questions and all were answered. The patient agreed with the plan and demonstrated an understanding of the instructions.   The patient was advised to call back or seek an in-person evaluation if the symptoms worsen or if the condition fails to improve as anticipated.   Session Time: 40 minutes  Participation Level: Active  Behavioral Response: CasualAlertDepressed  Type of Therapy: Individual Therapy  Treatment Goals addressed: Jakhi will practice behavioral activation skills 3 times per week for the next 12 weeks   ProgressTowards Goals: Progressing  Interventions: CBT and Supportive  Summary:  REY DANSBY is a 48 y.o. male who presents for the scheduled appointment oriented x 5, appropriately dressed, and friendly.  Client denied hallucinations and delusions. Client reported on today he is doing fairly okay but still has the same stressors.  Client reported his home environment is very stressful.  Client reported his dad is an alcoholic and there is a lot of drama in the household. Client reported that he often retreats to being in his room but still feeling on guard to being related to what the yelling is about in the house. Client reported his immediate family is the same way who lives fairly close in the neighborhood. Client reported every time he sets out to do something or does  something good he continues to receive negative feedback from his family.  Client reported he constantly wakes up to his dad telling him negative things and putting him down unprovoked.  Client reported he finds that his mood can be irritable out of reactions to being uncomfortable at home. Client reported he is close to getting his license he just needs to go for a driver's test which she has been very nervous to do.     Suicidal/Homicidal: Nowithout intent/plan  Therapist Response:  Therapist began the appointment asking the client how he has been doing. Therapist engaged with active listening and positive emotional support. Therapist used cbt to engage and ask the client about contributing stressors that he is having a hard time coping with. Therapist used cbt to normalize his emotions and discuss normal reactions to trauma and negative interpersonal relationships, social anxiety. Therapist used cbt to teach the client about mindfulness, cognitive reframing, and progressive exposure to social settings. Therapist used CBT ask the client to identify his progress with frequency of use with coping skills with continued practice in his daily activity.    Therapist assigned the client homework to practice the skills discussed.  Plan: Return again in 4 weeks.  Diagnosis: mdd, recurrent, moderate with anxious distress  Collaboration of Care: Patient refused AEB none requested by the client.  Patient/Guardian was advised Release of Information must be obtained prior to any record release in order to collaborate their care with an outside provider. Patient/Guardian was advised if they have not already done so to contact the registration department to sign all necessary forms in order for [place] to release information  regarding their care.   Consent: Patient/Guardian gives verbal consent for treatment and assignment of benefits for services provided during this visit. Patient/Guardian expressed  understanding and agreed to proceed.   Neena Rhymes Jaidee Stipe, LCSW 07/24/2023

## 2023-07-25 ENCOUNTER — Encounter (HOSPITAL_COMMUNITY): Payer: Self-pay | Admitting: Physician Assistant

## 2023-07-25 ENCOUNTER — Ambulatory Visit (HOSPITAL_COMMUNITY): Payer: No Payment, Other | Admitting: Physician Assistant

## 2023-07-25 DIAGNOSIS — F331 Major depressive disorder, recurrent, moderate: Secondary | ICD-10-CM | POA: Diagnosis not present

## 2023-07-25 DIAGNOSIS — F411 Generalized anxiety disorder: Secondary | ICD-10-CM | POA: Diagnosis not present

## 2023-07-25 MED ORDER — SERTRALINE HCL 100 MG PO TABS: 200.0000 mg | ORAL_TABLET | Freq: Every day | ORAL | 1 refills | Status: DC

## 2023-07-25 MED ORDER — BUSPIRONE HCL 10 MG PO TABS: 10.0000 mg | ORAL_TABLET | Freq: Two times a day (BID) | ORAL | 1 refills | Status: DC

## 2023-07-25 NOTE — Progress Notes (Signed)
 What he wants [facility] MD/PA/NP OP Progress Note  07/25/2023 5:46 PM Eduardo Mcdowell  MRN:  [id number]  Chief Complaint:  Chief Complaint  Patient presents with   Follow-up   Medication Management   HPI:   Eduardo Mcdowell is a 48 year old male with a past psychiatric history significant for major depressive disorder (recurrent, moderate episode), generalized anxiety disorder, and insomnia who presents to [facility] for follow-up and medication management.  Patient is currently being managed on the following psychiatric medications:  Sertraline 150 mg daily Buspirone 7.5 mg 2 times daily  Patient presents to the encounter stating that he has been more active lately now that it has warmed up.  He reports that he has been taking his medications regularly; however, he reports that he still has issues with anxiety especially when around people.  Patient endorses anxiety off and on during social settings.  Patient continues to endorse depression stating that he often wants to stay in his room and watch TV.  Patient cites that some of his depression is attributed to drama going on in the household.  Patient reports that he is constantly surrounded by negativity regardless of what he does.  In addition to depression and anxiety, patient endorses panic attacks characterized by heavy breathing and chest pain.  Patient reports that he has been engaging in activities to help alleviate his symptoms.  He reports that he will do things outside to keep himself busy, though when he sees people coming up the street, he will go back into the home.  Patient endorses some paranoia characterized by thinking that things are going against him.  Patient reports that he has set goals for himself such as going back to work and obtaining his driver's license.  A PHQ-9 screen was performed the patient scoring a 19.  A GAD-7 screen was also performed the patient scoring a 21.  Patient is alert  and oriented x 4, calm, cooperative, and fully engaged in conversation during the encounter.  Patient describes his mood as optimistic stating that he is trying to stay motivated.  Patient exhibits depressed and anxious mood with appropriate affect.  Patient denies suicidal or homicidal ideations.  He further denies auditory or visual hallucinations and does not appear to be responding to internal/external stimuli.  Patient endorses fair sleep stating that he often goes to bed at 10 PM or 11 PM only to wake up 4 hours later.  Patient endorses fair appetite and eats on average 1-2 meals per day.  Patient denies alcohol consumption or illicit drug use.  Patient endorses tobacco use and smokes on average a pack per day.  Visit Diagnosis:    ICD-10-CM   1. Moderate episode of recurrent major depressive disorder (HCC)  F33.1 sertraline (ZOLOFT) 100 MG tablet    2. Generalized anxiety disorder  F41.1 sertraline (ZOLOFT) 100 MG tablet    busPIRone (BUSPAR) 10 MG tablet      Past Psychiatric History:  Patient endorses a past psychiatric history significant for depression and anxiety. - Patient has been diagnosed with major depressive disorder and generalized anxiety disorder   Patient denies a past history of hospitalization due to mental health   Patient denies a past history of suicide attempt   Patient denies a past history of homicide attempt  Past Medical History: History reviewed. No pertinent past medical history.  Past Surgical History:  Procedure Laterality Date   LEG SURGERY      Family Psychiatric  History:  Patient reports that his mother's side of the family have a history of schizophrenia.  Patient reports that alcohol abuse runs in the family.   Family history of suicide attempt: Patient denies Family history of homicide attempt: Patient denies Family history of substance abuse: Patient endorses a family history of alcohol abuse  Family History: History reviewed. No pertinent  family history.  Social History:  Social History   Socioeconomic History   Marital status: Married    Spouse name: Not on file   Number of children: Not on file   Years of education: Not on file   Highest education level: Not on file  Occupational History   Not on file  Tobacco Use   Smoking status: Every Day    Types: Cigarettes   Smokeless tobacco: Not on file  Substance and Sexual Activity   Alcohol use: Yes   Drug use: Yes    Types: Methamphetamines   Sexual activity: Not on file  Other Topics Concern   Not on file  Social History Narrative   Not on file   Social Drivers of Health   Financial Resource Strain: Not on file  Food Insecurity: Not on file  Transportation Needs: Not on file  Physical Activity: Not on file  Stress: Not on file  Social Connections: Not on file    Allergies: No Known Allergies  Metabolic Disorder Labs: No results found for: "HGBA1C", "MPG" No results found for: "PROLACTIN" No results found for: "CHOL", "TRIG", "HDL", "CHOLHDL", "VLDL", "LDLCALC" No results found for: "TSH"  Therapeutic Level Labs: No results found for: "LITHIUM" No results found for: "VALPROATE" No results found for: "CBMZ"  Current Medications: Current Outpatient Medications  Medication Sig Dispense Refill   Aspirin-Salicylamide-Caffeine (BC HEADACHE POWDER PO) Take 1 packet by mouth 3 (three) times daily as needed (pain).     busPIRone (BUSPAR) 10 MG tablet Take 1 tablet (10 mg total) by mouth 2 (two) times daily. 60 tablet 1   doxycycline (VIBRAMYCIN) 100 MG capsule Take 1 capsule (100 mg total) by mouth 2 (two) times daily. One po bid x 7 days (Patient not taking: Reported on 04/21/2014) 14 capsule 0   ibuprofen (ADVIL,MOTRIN) 800 MG tablet Take 1 tablet (800 mg total) by mouth 3 (three) times daily. 21 tablet 0   oxyCODONE-acetaminophen (PERCOCET/ROXICET) 5-325 MG per tablet Take 1 tablet by mouth every 8 (eight) hours as needed for moderate pain or severe pain.  7 tablet 0   penicillin v potassium (VEETID) 500 MG tablet Take 1 tablet (500 mg total) by mouth 4 (four) times daily. 40 tablet 0   sertraline (ZOLOFT) 100 MG tablet Take 2 tablets (200 mg total) by mouth daily. 60 tablet 1   traZODone (DESYREL) 50 MG tablet Take 1 tablet (50 mg total) by mouth at bedtime. 30 tablet 1   No current facility-administered medications for this visit.     Musculoskeletal: Strength & Muscle Tone: within normal limits Gait & Station: normal Patient leans: N/A  Psychiatric Specialty Exam: Review of Systems  Psychiatric/Behavioral:  Positive for dysphoric mood and sleep disturbance. Negative for decreased concentration, hallucinations, self-injury and suicidal ideas. The patient is nervous/anxious. The patient is not hyperactive.     Blood pressure 122/87, pulse 62, temperature 98.1 F (36.7 C), temperature source Oral, height 6\' 4"  (1.93 m), weight 205 lb 6.4 oz (93.2 kg), SpO2 99%.Body mass index is 25 kg/m.  General Appearance: Casual  Eye Contact:  Good  Speech:  Clear and  Coherent and Normal Rate  Volume:  Normal  Mood:  Anxious and Depressed  Affect:  Congruent  Thought Process:  Coherent, Goal Directed, and Descriptions of Associations: Intact  Orientation:  Full (Time, Place, and Person)  Thought Content: WDL   Suicidal Thoughts:  No  Homicidal Thoughts:  No  Memory:  Immediate;   Good Recent;   Fair Remote;   Fair  Judgement:  Good  Insight:  Good  Psychomotor Activity:  Normal  Concentration:  Concentration: Good and Attention Span: Good  Recall:  Good  Fund of Knowledge: Fair  Language: Good  Akathisia:  No  Handed:  Right  AIMS (if indicated): not done  Assets:  Communication Skills Desire for Improvement Housing Social Support  ADL's:  Intact  Cognition: WNL  Sleep:  Fair   Screenings: GAD-7    Flowsheet Row Clinical Support from 07/25/2023 in [facility] Clinical Support from 06/13/2023 in  [facility] Office Visit from 04/24/2023 in [facility]  Total GAD-7 Score 21 21 21       PHQ2-9    Flowsheet Row Clinical Support from 07/25/2023 in [facility] Clinical Support from 06/13/2023 in [facility] Office Visit from 04/24/2023 in [facility]  PHQ-2 Total Score 6 5 5   PHQ-9 Total Score 19 20 22       Flowsheet Row Clinical Support from 07/25/2023 in [facility] from 06/14/2023 in [facility] Clinical Support from 06/13/2023 in [facility]  C-SSRS RISK CATEGORY No Risk No Risk No Risk        Assessment and Plan:   Rilee Wendling. Garate is a 48 year old male with a past psychiatric history significant for major depressive disorder (recurrent, moderate episode), generalized anxiety disorder, and insomnia who presents to [facility] for follow-up and medication management.  Patient presents to the encounter stating that he has been taking his medications regularly.  Despite taking his medications regularly, patient continues to endorse ongoing depression and anxiety.  In addition to depression and anxiety, patient also endorses panic attacks that appear to be attributed to being around people or being in social settings.  In addition to taking his medications, patient reports that he has been trying to keep himself busy to help alleviate his symptoms.  Patient has also set forth goals to help improve his quality of life.  Patient is interested in adjusting his dosage of sertraline and buspirone for the management of his ongoing depression and anxiety.  Patient's sertraline to be adjusted from 150 mg to 200 mg daily for the management of his depressive symptoms, anxiety, and panic attacks.  Patient's buspirone to be adjusted from  7.5 mg to 10 mg 2 times daily for the management of his anxiety.  Patient was agreeable to recommendations.  Patient's medications to be e-prescribed to pharmacy of choice.  Collaboration of Care: Collaboration of Care: Medication Management AEB provider managing patient's psychiatric medications, Psychiatrist AEB patient being followed by mental health provider at that facility, and Referral or follow-up with counselor/therapist AEB patient being seen by a licensed clinical social worker at this facility  Patient/Guardian was advised Release of Information must be obtained prior to any record release in order to collaborate their care with an outside provider. Patient/Guardian was advised if they have not already done so to contact the registration department to sign  all necessary forms in order for [place] to release information regarding their care.   Consent: Patient/Guardian gives verbal consent for treatment and assignment of benefits for services provided during this visit. Patient/Guardian expressed understanding and agreed to proceed.   1. Moderate episode of recurrent major depressive disorder (HCC)  - sertraline (ZOLOFT) 100 MG tablet; Take 2 tablets (200 mg total) by mouth daily.  Dispense: 60 tablet; Refill: 1  2. Generalized anxiety disorder  - sertraline (ZOLOFT) 100 MG tablet; Take 2 tablets (200 mg total) by mouth daily.  Dispense: 60 tablet; Refill: 1 - busPIRone (BUSPAR) 10 MG tablet; Take 1 tablet (10 mg total) by mouth 2 (two) times daily.  Dispense: 60 tablet; Refill: 1  Patient to follow up in 6 weeks Provider spent a total of 19 minutes with the patient/reviewing patient's chart  Meta Hatchet, PA 07/25/2023, 5:46 PM

## 2023-08-13 ENCOUNTER — Ambulatory Visit (INDEPENDENT_AMBULATORY_CARE_PROVIDER_SITE_OTHER): Payer: No Payment, Other | Admitting: Clinical

## 2023-08-13 DIAGNOSIS — F331 Major depressive disorder, recurrent, moderate: Secondary | ICD-10-CM

## 2023-08-13 NOTE — Progress Notes (Signed)
 THERAPIST PROGRESS NOTE Virtual Visit via Video Note  I connected with [name] on 08/13/23 at 11:00 AM EDT by a video enabled telemedicine application and verified that I am speaking with the correct person using two identifiers.  Location: Patient: home  Provider: office   I discussed the limitations of evaluation and management by telemedicine and the availability of in person appointments. The patient expressed understanding and agreed to proceed.   Follow Up Instructions: I discussed the assessment and treatment plan with the patient. The patient was provided an opportunity to ask questions and all were answered. The patient agreed with the plan and demonstrated an understanding of the instructions.   The patient was advised to call back or seek an in-person evaluation if the symptoms worsen or if the condition fails to improve as anticipated.   Session Time: 30 minutes  Participation Level: Active  Behavioral Response: CasualAlertAnxious  Type of Therapy: Individual Therapy  Treatment Goals addressed: [name] will practice behavioral activation skills 3 times per week for the next 12 weeks   ProgressTowards Goals: Progressing  Interventions: CBT  Summary:  Eduardo Mcdowell is a 48 y.o. male who presents for the scheduled appointment oriented x 5, appropriately dressed, and friendly.  Client denied hallucinations and delusions. Client reported on today things were going about the same but he is doing fairly okay.  Client reported he has been working on being more intentional about getting himself out of the house.  Client reported he walks to his cousin's house that is not that far from his family's house.  Client reported however he feels like when he does 1 thing right something wrong happens. Client reported he was minding his business walking down the road and some people intentionally said some hateful things towards him. Client reported he did not engage with them but  it made him feel bad about himself. 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  Client reported he did attempt to go to the [facility] but he was not able to be seen due to how busy it was with appointments.  Client reported he will go back again when he is able to have someone take him soon.  Client reported he knows there are things that he should avoid but he has been working on not seeing challenges at this time that he should go back into isolation. Evidence of progress towards goal:  client reported 1 positive of continuing to confront his fears with social anxiety.  Suicidal/Homicidal: Nowithout intent/plan  Therapist Response:  Therapist began the appointment asking the client how he has been doing. Therapist engaged with active listening and positive emotional support. Therapist used cbt to ask the client about his progression with managing anxiety and depressive symptoms. Therapist used cbt to have the client identify how his symptoms also relate to past events. Therapist used cbt to teach the client about reframing his thoughts and giving himself exposure to being in public settings. Therapist used CBT ask the client to identify his progress with frequency of use with coping skills with continued practice in his daily activity.    Therapist assigned the client homework to practice the skills discussed.   Plan: Return again in 4 weeks.  Diagnosis: moderate episode of recurrent  major depressive disorder  Collaboration of Care: Patient refused AEB none requested by the client.  Patient/Guardian was advised Release of Information must be obtained prior to any record release in order to collaborate their care with an outside provider. Patient/Guardian was advised if they  have not already done so to contact the registration department to sign all necessary forms in order for us  to release information regarding their care.   Consent: Patient/Guardian gives verbal consent for treatment and assignment of benefits for services provided during this visit. Patient/Guardian expressed understanding and agreed to proceed.   Jeydi Klingel Y Ysabela Keisler, LCSW 08/13/2023

## 2023-09-04 ENCOUNTER — Telehealth (HOSPITAL_COMMUNITY): Payer: Self-pay | Admitting: Physician Assistant

## 2023-09-04 NOTE — Telephone Encounter (Signed)
 Please look into changing the patients pharmacy. He said [facility] cost to much. Thanks!

## 2023-09-05 ENCOUNTER — Encounter (HOSPITAL_COMMUNITY): Admitting: Physician Assistant

## 2023-09-11 ENCOUNTER — Other Ambulatory Visit: Payer: Self-pay

## 2023-09-11 ENCOUNTER — Other Ambulatory Visit (HOSPITAL_COMMUNITY): Payer: Self-pay | Admitting: Physician Assistant

## 2023-09-11 ENCOUNTER — Ambulatory Visit (INDEPENDENT_AMBULATORY_CARE_PROVIDER_SITE_OTHER): Payer: No Payment, Other | Admitting: Clinical

## 2023-09-11 DIAGNOSIS — F331 Major depressive disorder, recurrent, moderate: Secondary | ICD-10-CM

## 2023-09-11 DIAGNOSIS — F5105 Insomnia due to other mental disorder: Secondary | ICD-10-CM

## 2023-09-11 DIAGNOSIS — F411 Generalized anxiety disorder: Secondary | ICD-10-CM

## 2023-09-11 MED ORDER — TRAZODONE HCL 50 MG PO TABS
50.0000 mg | ORAL_TABLET | Freq: Every day | ORAL | 1 refills | Status: DC
  Filled 2023-09-11: qty 30, 30d supply, fill #0

## 2023-09-11 MED ORDER — SERTRALINE HCL 100 MG PO TABS
200.0000 mg | ORAL_TABLET | Freq: Every day | ORAL | 1 refills | Status: DC
  Filled 2023-09-11: qty 60, 30d supply, fill #0

## 2023-09-11 MED ORDER — BUSPIRONE HCL 10 MG PO TABS
10.0000 mg | ORAL_TABLET | Freq: Two times a day (BID) | ORAL | 1 refills | Status: DC
  Filled 2023-09-11: qty 60, 30d supply, fill #0

## 2023-09-11 NOTE — Progress Notes (Signed)
Medication sent to a different pharmacy 

## 2023-09-11 NOTE — Progress Notes (Signed)
 THERAPIST PROGRESS NOTE Virtual Visit via Video Note  I connected with Eduardo Mcdowell on 09/11/23 at 11:00 AM EDT by a video enabled telemedicine application and verified that I am speaking with the correct person using two identifiers.  Location: Patient: home Provider: office   I discussed the limitations of evaluation and management by telemedicine and the availability of in person appointments. The patient expressed understanding and agreed to proceed.   Follow Up Instructions: I discussed the assessment and treatment plan with the patient. The patient was provided an opportunity to ask questions and all were answered. The patient agreed with the plan and demonstrated an understanding of the instructions.   The patient was advised to call back or seek an in-person evaluation if the symptoms worsen or if the condition fails to improve as anticipated.   Session Time: 40 minutes  Participation Level: Active  Behavioral Response: CasualAlertDepressed  Type of Therapy: Individual Therapy  Treatment Goals addressed: Reduce frequency, intensity, and duration of depression symptoms so that daily functioning is improved by self report 1x per session   ProgressTowards Goals: Progressing  Interventions: CBT and Supportive  Summary:  Eduardo Mcdowell is a 48 y.o. male who presents for the scheduled appointment oriented times five, appropriately dressed and friendly. Client denied hallucinations and delusions. Client reported on today a lot has been going on. Client reported his uncle and aunt have not been well. Client reported his uncle passed away. Client reported he has been going to visit them frequently. Client reported the man who has been giving him different jobs to help with for money also was hospitalized. Client reported he has primarily been at the house but has made efforts to still be productive in other ways. Client reported when he can catch a ride he makes initiative to  engage in stores and hold small talk with other people. Client reported he is working to stay optimistic because he knows things wont always be this way so long as he does what he can do within his control right now for his future. Client reported he is worried about running out of his medication from the psychiatrist.  Evidence of progress towards goal:  client reported 1 positive of keeping positive cognitions. Client reported she is medication compliant 7 days per week.  Suicidal/Homicidal: Nowithout intent/plan  Therapist Response:  Therapist began the appointment asking the client how he has bene doing. Therapist engaged with active listening and positive emotional support. Therapist used cbt to engage and ask the client how is managing mild symptoms of grief, family issues, and psychosocial needs. Therapist used cbt to continue to teach and reinforce his resilience and confidence in small goals achieved. Therapist used CBT ask the client to identify his progress with frequency of use with coping skills with continued practice in his daily activity.    Therapist assigned the client homework to practice.  Plan: Return again in 4 weeks.  Diagnosis: moderate episode of recurrent major depressive  Collaboration of Care: Patient refused AEB none requested.  Patient/Guardian was advised Release of Information must be obtained prior to any record release in order to collaborate their care with an outside provider. Patient/Guardian was advised if they have not already done so to contact the registration department to sign all necessary forms in order for us  to release information regarding their care.   Consent: Patient/Guardian gives verbal consent for treatment and assignment of benefits for services provided during this visit. Patient/Guardian expressed understanding and agreed  to proceed.   Kerry Chisolm Y Fabiano Ginley, LCSW 09/11/2023

## 2023-09-21 ENCOUNTER — Other Ambulatory Visit: Payer: Self-pay

## 2023-09-21 ENCOUNTER — Ambulatory Visit (INDEPENDENT_AMBULATORY_CARE_PROVIDER_SITE_OTHER): Admitting: Physician Assistant

## 2023-09-21 VITALS — BP 143/106 | HR 85 | Temp 98.2°F | Ht 76.0 in | Wt 206.4 lb

## 2023-09-21 DIAGNOSIS — F411 Generalized anxiety disorder: Secondary | ICD-10-CM | POA: Diagnosis not present

## 2023-09-21 DIAGNOSIS — F331 Major depressive disorder, recurrent, moderate: Secondary | ICD-10-CM | POA: Diagnosis not present

## 2023-09-21 DIAGNOSIS — F99 Mental disorder, not otherwise specified: Secondary | ICD-10-CM

## 2023-09-21 DIAGNOSIS — Z758 Other problems related to medical facilities and other health care: Secondary | ICD-10-CM | POA: Diagnosis not present

## 2023-09-21 DIAGNOSIS — F5105 Insomnia due to other mental disorder: Secondary | ICD-10-CM | POA: Diagnosis not present

## 2023-09-21 MED ORDER — TRAZODONE HCL 50 MG PO TABS
50.0000 mg | ORAL_TABLET | Freq: Every evening | ORAL | 1 refills | Status: DC | PRN
  Filled 2023-09-21 – 2023-10-29 (×3): qty 30, 30d supply, fill #0

## 2023-09-21 MED ORDER — BUSPIRONE HCL 15 MG PO TABS
15.0000 mg | ORAL_TABLET | Freq: Two times a day (BID) | ORAL | 1 refills | Status: DC
  Filled 2023-09-21 – 2023-10-05 (×2): qty 60, 30d supply, fill #0

## 2023-09-21 MED ORDER — SERTRALINE HCL 100 MG PO TABS
200.0000 mg | ORAL_TABLET | Freq: Every day | ORAL | 1 refills | Status: DC
  Filled 2023-09-21 – 2023-10-29 (×3): qty 60, 30d supply, fill #0

## 2023-09-24 ENCOUNTER — Other Ambulatory Visit: Payer: Self-pay

## 2023-09-26 ENCOUNTER — Encounter (HOSPITAL_COMMUNITY): Payer: Self-pay | Admitting: Physician Assistant

## 2023-09-26 NOTE — Progress Notes (Signed)
 BH MD/PA/NP OP Progress Note  09/21/2023 9:27 PM Eduardo NORDQUIST  MRN:  [id number]  Chief Complaint:  Chief Complaint  Patient presents with   Follow-up   Medication Management   HPI:   Eduardo Mcdowell is a 48 year old male with a past psychiatric history significant for major depressive disorder (recurrent, moderate episode), generalized anxiety disorder, and insomnia who presents to [facility] for follow-up and medication management.  Patient is currently being managed on the following psychiatric medications:  Sertraline  150 mg daily Buspirone  10 mg 2 times daily  Patient reports that he feels numb and his nerves are torn up.  Patient reports that his uncle recently passed.  He also reports that his aunt recently went to the hospital.  He reports that he tries to keep himself busy but he is slowly reverting back to his previous behavior of lying in bed all day and not wanting to do anything.  Patient reports that he tries to alleviate his symptoms by engaging in deep breathing exercises.  Patient endorses depression stating that he feels overwhelmed.  Patient endorses the following depressive symptoms: feelings of sadness and worthlessness.  He reports that nothing is going his way and everything is going to crap.  Patient continues to endorse anxiety especially when around people.  A PHQ-9 screen was performed with the patient scoring at 17.  A GAD-7 screen was also performed with the patient scoring a 21.  Patient is alert and oriented x 4, calm, cooperative, and fully engaged in conversation during the encounter.  Patient describes his mood as numb.  Patient exhibits depressed mood with congruent affect.  Patient denies suicidal or homicidal ideations.  He further denies auditory or visual hallucinations and does not appear to be responding to internal/external stimuli.  Patient endorses poor sleep and receives on average 4 hours of intermittent sleep per  night.  Patient endorses fair appetite and eats on average 1-2 meals per day.  Patient denies alcohol consumption or illicit drug use.  Patient endorses tobacco use and smokes an average a pack per day.  Visit Diagnosis:    ICD-10-CM   1. Does not have primary care provider  Z75.8 Ambulatory referral to Internal Medicine    2. Generalized anxiety disorder  F41.1 sertraline  (ZOLOFT ) 100 MG tablet    busPIRone  (BUSPAR ) 15 MG tablet    3. Moderate episode of recurrent major depressive disorder (HCC)  F33.1 sertraline  (ZOLOFT ) 100 MG tablet    4. Insomnia due to other mental disorder  F51.05 traZODone  (DESYREL ) 50 MG tablet   F99       Past Psychiatric History:  Patient endorses a past psychiatric history significant for depression and anxiety. - Patient has been diagnosed with major depressive disorder and generalized anxiety disorder   Patient denies a past history of hospitalization due to mental health   Patient denies a past history of suicide attempt   Patient denies a past history of homicide attempt  Past Medical History: History reviewed. No pertinent past medical history.  Past Surgical History:  Procedure Laterality Date   LEG SURGERY      Family Psychiatric History:  Patient reports that his mother's side of the family have a history of schizophrenia.  Patient reports that alcohol abuse runs in the family.   Family history of suicide attempt: Patient denies Family history of homicide attempt: Patient denies Family history of substance abuse: Patient endorses a family history of alcohol abuse  Family History:  History reviewed. No pertinent family history.  Social History:  Social History   Socioeconomic History   Marital status: Married    Spouse name: Not on file   Number of children: Not on file   Years of education: Not on file   Highest education level: Not on file  Occupational History   Not on file  Tobacco Use   Smoking status: Every Day    Types:  Cigarettes   Smokeless tobacco: Not on file  Substance and Sexual Activity   Alcohol use: Yes   Drug use: Yes    Types: Methamphetamines   Sexual activity: Not on file  Other Topics Concern   Not on file  Social History Narrative   Not on file   Social Drivers of Health   Financial Resource Strain: Not on file  Food Insecurity: Not on file  Transportation Needs: Not on file  Physical Activity: Not on file  Stress: Not on file  Social Connections: Not on file    Allergies: No Known Allergies  Metabolic Disorder Labs: No results found for: HGBA1C, MPG No results found for: PROLACTIN No results found for: CHOL, TRIG, HDL, CHOLHDL, VLDL, LDLCALC No results found for: TSH  Therapeutic Level Labs: No results found for: LITHIUM No results found for: VALPROATE No results found for: CBMZ  Current Medications: Current Outpatient Medications  Medication Sig Dispense Refill   Aspirin-Salicylamide-Caffeine (BC HEADACHE POWDER PO) Take 1 packet by mouth 3 (three) times daily as needed (pain).     busPIRone  (BUSPAR ) 15 MG tablet Take 1 tablet (15 mg total) by mouth 2 (two) times daily. 60 tablet 1   doxycycline  (VIBRAMYCIN ) 100 MG capsule Take 1 capsule (100 mg total) by mouth 2 (two) times daily. One po bid x 7 days (Patient not taking: Reported on 04/21/2014) 14 capsule 0   ibuprofen  (ADVIL ,MOTRIN ) 800 MG tablet Take 1 tablet (800 mg total) by mouth 3 (three) times daily. 21 tablet 0   oxyCODONE -acetaminophen  (PERCOCET/ROXICET) 5-325 MG per tablet Take 1 tablet by mouth every 8 (eight) hours as needed for moderate pain or severe pain. 7 tablet 0   penicillin  v potassium (VEETID) 500 MG tablet Take 1 tablet (500 mg total) by mouth 4 (four) times daily. 40 tablet 0   sertraline  (ZOLOFT ) 100 MG tablet Take 2 tablets (200 mg total) by mouth daily. 60 tablet 1   traZODone  (DESYREL ) 50 MG tablet Take 1 tablet (50 mg total) by mouth at bedtime as needed for sleep. 30  tablet 1   No current facility-administered medications for this visit.     Musculoskeletal: Strength & Muscle Tone: within normal limits Gait & Station: normal Patient leans: N/A  Psychiatric Specialty Exam: Review of Systems  Psychiatric/Behavioral:  Positive for dysphoric mood and sleep disturbance. Negative for decreased concentration, hallucinations, self-injury and suicidal ideas. The patient is nervous/anxious. The patient is not hyperactive.     Blood pressure (!) 143/106, pulse 85, temperature 98.2 F (36.8 C), temperature source Oral, height 6' 4 (1.93 m), weight 206 lb 6.4 oz (93.6 kg), SpO2 98%.Body mass index is 25.12 kg/m.  General Appearance: Casual  Eye Contact:  Good  Speech:  Clear and Coherent and Normal Rate  Volume:  Normal  Mood:  Anxious and Depressed  Affect:  Congruent  Thought Process:  Coherent, Goal Directed, and Descriptions of Associations: Intact  Orientation:  Full (Time, Place, and Person)  Thought Content: WDL   Suicidal Thoughts:  No  Homicidal Thoughts:  No  Memory:  Immediate;   Good Recent;   Fair Remote;   Fair  Judgement:  Good  Insight:  Good  Psychomotor Activity:  Normal  Concentration:  Concentration: Good and Attention Span: Good  Recall:  Good  Fund of Knowledge: Fair  Language: Good  Akathisia:  No  Handed:  Right  AIMS (if indicated): not done  Assets:  Communication Skills Desire for Improvement Housing Social Support  ADL's:  Intact  Cognition: WNL  Sleep:  Poor   Screenings: GAD-7    Flowsheet Row Clinical Support from 09/21/2023 in [facility] Clinical Support from 07/25/2023 in [facility] Clinical Support from 06/13/2023 in [facility] Office Visit from 04/24/2023 in [facility]  Total GAD-7 Score 21 21 21 21       PHQ2-9    Flowsheet Row Clinical Support from 09/21/2023 in [facility] Clinical Support from 07/25/2023 in [facility] Clinical Support from 06/13/2023 in [facility] Office Visit from 04/24/2023 in [place]  PHQ-2 Total Score 4 6 5 5   PHQ-9 Total Score 17 19 20 22       Flowsheet Row Clinical Support from 09/21/2023 in [facility] Clinical Support from 07/25/2023 in [facility] Counselor from 06/14/2023 in [facility]  C-SSRS RISK CATEGORY No Risk No Risk No Risk        Assessment and Plan:   Eduardo Mcdowell is a 48 year old male with a past psychiatric history significant for major depressive disorder (recurrent, moderate episode), generalized anxiety disorder, and insomnia who presents to [facility] for follow-up and medication management.  Patient presents to the encounter stating that he is reverting back to his old behaviors such as lying in bed all day and doing nothing.  He endorses ongoing depression and anxiety attributed to external stressors in his life.  A PHQ-9 screen was performed with the patient scoring a 17.  A GAD-7 screen was also performed with the patient scoring a 21.  In addition to ongoing depression and anxiety, patient reports that his sleep has also been poor.  Provider recommended adjusting patient's buspirone  dosage from 10 mg 2 times daily to 15 mg 2 times daily for the management of his anxiety.  Provider also recommended placing patient on trazodone  50 mg at bedtime for the management of his sleep.  Patient was agreeable to recommendation.  Patient's medications to be prescribed to pharmacy of choice.  Patient denies suicidal ideations and is able to contract for safety following the conclusion of the encounter.  Collaboration of Care: Collaboration of Care: Medication Management AEB provider managing  patient's psychiatric medications, Psychiatrist AEB patient being followed by mental health provider at that facility, and Referral or follow-up with counselor/therapist AEB patient being seen by a licensed clinical social worker at this facility  Patient/Guardian was advised Release of Information must be obtained prior to any record release in order to collaborate their care with an outside provider. Patient/Guardian was advised if they have not already done so to contact the registration department to sign all necessary forms in order for us  to release information regarding their care.   Consent: Patient/Guardian gives verbal consent for treatment and assignment of benefits for services provided during this visit. Patient/Guardian expressed understanding and agreed to proceed.   1. Generalized anxiety disorder  -  sertraline  (ZOLOFT ) 100 MG tablet; Take 2 tablets (200 mg total) by mouth daily.  Dispense: 60 tablet; Refill: 1 - busPIRone  (BUSPAR ) 15 MG tablet; Take 1 tablet (15 mg total) by mouth 2 (two) times daily.  Dispense: 60 tablet; Refill: 1  2. Moderate episode of recurrent major depressive disorder (HCC)  - sertraline  (ZOLOFT ) 100 MG tablet; Take 2 tablets (200 mg total) by mouth daily.  Dispense: 60 tablet; Refill: 1  3. Insomnia due to other mental disorder  - traZODone  (DESYREL ) 50 MG tablet; Take 1 tablet (50 mg total) by mouth at bedtime as needed for sleep.  Dispense: 30 tablet; Refill: 1  4. Does not have primary care provider (Primary)  - Ambulatory referral to Internal Medicine  Patient to follow up in 6 weeks Provider spent a total of 21 minutes with the patient/reviewing patient's chart  Gates Kasal, PA 09/21/2023, 9:27 PM

## 2023-09-28 ENCOUNTER — Encounter (HOSPITAL_COMMUNITY): Admitting: Physician Assistant

## 2023-10-02 ENCOUNTER — Other Ambulatory Visit: Payer: Self-pay

## 2023-10-05 ENCOUNTER — Other Ambulatory Visit: Payer: Self-pay

## 2023-10-23 ENCOUNTER — Other Ambulatory Visit: Payer: Self-pay

## 2023-10-29 ENCOUNTER — Other Ambulatory Visit: Payer: Self-pay

## 2023-11-01 ENCOUNTER — Other Ambulatory Visit: Payer: Self-pay

## 2023-11-07 ENCOUNTER — Ambulatory Visit (HOSPITAL_COMMUNITY): Payer: Self-pay | Admitting: Physician Assistant

## 2023-11-07 ENCOUNTER — Other Ambulatory Visit: Payer: Self-pay

## 2023-11-07 ENCOUNTER — Encounter (HOSPITAL_COMMUNITY): Payer: Self-pay | Admitting: Physician Assistant

## 2023-11-07 DIAGNOSIS — F411 Generalized anxiety disorder: Secondary | ICD-10-CM

## 2023-11-07 DIAGNOSIS — F331 Major depressive disorder, recurrent, moderate: Secondary | ICD-10-CM | POA: Diagnosis not present

## 2023-11-07 DIAGNOSIS — F5105 Insomnia due to other mental disorder: Secondary | ICD-10-CM | POA: Diagnosis not present

## 2023-11-07 DIAGNOSIS — F99 Mental disorder, not otherwise specified: Secondary | ICD-10-CM

## 2023-11-07 MED ORDER — BUSPIRONE HCL 15 MG PO TABS
15.0000 mg | ORAL_TABLET | Freq: Two times a day (BID) | ORAL | 2 refills | Status: DC
  Filled 2023-11-07: qty 60, 30d supply, fill #0
  Filled 2023-12-25 (×2): qty 60, 30d supply, fill #1

## 2023-11-07 MED ORDER — TRAZODONE HCL 100 MG PO TABS
100.0000 mg | ORAL_TABLET | Freq: Every evening | ORAL | 2 refills | Status: DC | PRN
Start: 2023-11-07 — End: 2024-02-14
  Filled 2023-11-07: qty 30, 30d supply, fill #0
  Filled 2023-12-25 (×2): qty 30, 30d supply, fill #1

## 2023-11-07 MED ORDER — SERTRALINE HCL 100 MG PO TABS
200.0000 mg | ORAL_TABLET | Freq: Every day | ORAL | 2 refills | Status: DC
  Filled 2023-11-07 – 2023-12-25 (×3): qty 60, 30d supply, fill #0

## 2023-11-07 NOTE — Progress Notes (Signed)
 BH MD/PA/NP OP Progress Note  11/07/2023 11:00 AM Eduardo Mcdowell  MRN:  [id number]  Chief Complaint:  Chief Complaint  Patient presents with   Follow-up   Medication Management   HPI:   Eduardo Mcdowell is a 48 year old male with a past psychiatric history significant for major depressive disorder (recurrent, moderate episode), generalized anxiety disorder, and insomnia who presents to [facility] for follow-up and medication management.  Patient is currently being managed on the following psychiatric medications:  Sertraline  150 mg daily Buspirone  15 mg 2 times daily Trazodone  50 mg at bedtime as needed  Patient reports that he has been taking his medication regularly and notes that he occasionally feels out of it when taking his medication.  Patient notes that he has obtained insurance and will be going to a primary care provider to address his other aspects of health.  Patient reports that his depression has not bothered him as much since taking his medications regularly and keeping himself busy.  He reports that he feels a lot better.  He also reports that his anxiety has not been as bad and states that he has been better about going out in public.  A PHQ-9 screen was performed with the patient scoring an 11.  A GAD-7 screen was also performed with the patient scoring a 20.  Patient is alert and oriented x 4, calm, cooperative, and fully engaged in conversation during the encounter.  Patient endorses fine mood.  He reports that his mood has been positive since receiving insurance.  Patient exhibits euthymic mood with appropriate affect.  Patient denies suicidal or homicidal ideations.  He further denies auditory or visual hallucinations and does not appear to be responding to internal/external stimuli.  Patient endorses fair sleep and receives on average 5 to 6 hours of sleep per night.  Patient endorses fair appetite and eats on average 1 meal per day.   Patient denies alcohol consumption or illicit drug use.  Patient endorses tobacco use and smokes on hours a pack per day.  Visit Diagnosis:    ICD-10-CM   1. Insomnia due to other mental disorder  F51.05 traZODone  (DESYREL ) 100 MG tablet   F99     2. Generalized anxiety disorder  F41.1 sertraline  (ZOLOFT ) 100 MG tablet    busPIRone  (BUSPAR ) 15 MG tablet    3. Moderate episode of recurrent major depressive disorder (HCC)  F33.1 sertraline  (ZOLOFT ) 100 MG tablet       Past Psychiatric History:  Patient endorses a past psychiatric history significant for depression and anxiety. - Patient has been diagnosed with major depressive disorder and generalized anxiety disorder   Patient denies a past history of hospitalization due to mental health   Patient denies a past history of suicide attempt   Patient denies a past history of homicide attempt  Past Medical History: History reviewed. No pertinent past medical history.  Past Surgical History:  Procedure Laterality Date   LEG SURGERY      Family Psychiatric History:  Patient reports that his mother's side of the family have a history of schizophrenia.  Patient reports that alcohol abuse runs in the family.   Family history of suicide attempt: Patient denies Family history of homicide attempt: Patient denies Family history of substance abuse: Patient endorses a family history of alcohol abuse  Family History: History reviewed. No pertinent family history.  Social History:  Social History   Socioeconomic History   Marital status: Married  Spouse name: Not on file   Number of children: Not on file   Years of education: Not on file   Highest education level: Not on file  Occupational History   Not on file  Tobacco Use   Smoking status: Every Day    Types: Cigarettes   Smokeless tobacco: Not on file  Substance and Sexual Activity   Alcohol use: Yes   Drug use: Yes    Types: Methamphetamines   Sexual activity: Not on  file  Other Topics Concern   Not on file  Social History Narrative   Not on file   Social Drivers of Health   Financial Resource Strain: Not on file  Food Insecurity: Not on file  Transportation Needs: Not on file  Physical Activity: Not on file  Stress: Not on file  Social Connections: Not on file    Allergies: No Known Allergies  Metabolic Disorder Labs: No results found for: HGBA1C, MPG No results found for: PROLACTIN No results found for: CHOL, TRIG, HDL, CHOLHDL, VLDL, LDLCALC No results found for: TSH  Therapeutic Level Labs: No results found for: LITHIUM No results found for: VALPROATE No results found for: CBMZ  Current Medications: Current Outpatient Medications  Medication Sig Dispense Refill   Aspirin-Salicylamide-Caffeine (BC HEADACHE POWDER PO) Take 1 packet by mouth 3 (three) times daily as needed (pain).     busPIRone  (BUSPAR ) 15 MG tablet Take 1 tablet (15 mg total) by mouth 2 (two) times daily. 60 tablet 2   doxycycline  (VIBRAMYCIN ) 100 MG capsule Take 1 capsule (100 mg total) by mouth 2 (two) times daily. One po bid x 7 days (Patient not taking: Reported on 04/21/2014) 14 capsule 0   ibuprofen  (ADVIL ,MOTRIN ) 800 MG tablet Take 1 tablet (800 mg total) by mouth 3 (three) times daily. 21 tablet 0   oxyCODONE -acetaminophen  (PERCOCET/ROXICET) 5-325 MG per tablet Take 1 tablet by mouth every 8 (eight) hours as needed for moderate pain or severe pain. 7 tablet 0   penicillin  v potassium (VEETID) 500 MG tablet Take 1 tablet (500 mg total) by mouth 4 (four) times daily. 40 tablet 0   sertraline  (ZOLOFT ) 100 MG tablet Take 2 tablets (200 mg total) by mouth daily. 60 tablet 2   traZODone  (DESYREL ) 100 MG tablet Take 1 tablet (100 mg total) by mouth at bedtime as needed for sleep. 30 tablet 2   No current facility-administered medications for this visit.     Musculoskeletal: Strength & Muscle Tone: within normal limits Gait & Station:  normal Patient leans: N/A  Psychiatric Specialty Exam: Review of Systems  Psychiatric/Behavioral:  Positive for dysphoric mood and sleep disturbance. Negative for decreased concentration, hallucinations, self-injury and suicidal ideas. The patient is nervous/anxious. The patient is not hyperactive.     Blood pressure (!) 142/90, pulse 78, height 6' 4 (1.93 m), weight 208 lb 9.6 oz (94.6 kg), SpO2 97%.Body mass index is 25.39 kg/m.  General Appearance: Casual  Eye Contact:  Good  Speech:  Clear and Coherent and Normal Rate  Volume:  Normal  Mood:  Anxious and Depressed  Affect:  Appropriate  Thought Process:  Coherent, Goal Directed, and Descriptions of Associations: Intact  Orientation:  Full (Time, Place, and Person)  Thought Content: WDL   Suicidal Thoughts:  No  Homicidal Thoughts:  No  Memory:  Immediate;   Good Recent;   Fair Remote;   Fair  Judgement:  Good  Insight:  Good  Psychomotor Activity:  Normal  Concentration:  Concentration:  Good and Attention Span: Good  Recall:  Good  Fund of Knowledge: Fair  Language: Good  Akathisia:  No  Handed:  Right  AIMS (if indicated): not done  Assets:  Communication Skills Desire for Improvement Housing Social Support  ADL's:  Intact  Cognition: WNL  Sleep:  Poor   Screenings: GAD-7    Flowsheet Row Clinical Support from 11/07/2023 in [facility] Clinical Support from 09/21/2023 in [facility] Clinical Support from 07/25/2023 in [facility] Clinical Support from 06/13/2023 in [facility] Office Visit from 04/24/2023 in [facility]  Total GAD-7 Score 20 21 21 21 21    PHQ2-9    Flowsheet Row Clinical Support from 11/07/2023 in [facility] Clinical Support from 09/21/2023 in [facility] Clinical Support from 07/25/2023 in [facility] Clinical Support from 06/13/2023 in [facility] Office Visit from 04/24/2023 in [facility]  PHQ-2 Total Score 2 4 6 5 5   PHQ-9 Total Score 11 17 19 20 22    Flowsheet Row Clinical Support from 11/07/2023 in [facility] Clinical Support from 09/21/2023 in [facility] Clinical Support from 07/25/2023 in [facility]  C-SSRS RISK CATEGORY No Risk No Risk No Risk     Assessment and Plan:   Eduardo Mcdowell is a 48 year old male with a past psychiatric history significant for major depressive disorder (recurrent, moderate episode), generalized anxiety disorder, and insomnia who presents to [facility] for follow-up and medication management.  Patient reports that when he takes his medications, he occasionally feels out of it.  Despite this feeling, patient reports that his medications have been helpful in managing his symptoms.  Patient reports that his depression has not bothered him as much since taking his medications regularly and states that his anxiety has not been as bad.  A PHQ-9 screen was performed with the patient scoring an 11.  A GAD-7 screen was also performed with the patient scoring a 20.  Patient informed provider that he still continues to experience fair sleep and receives on average 5 to 6 hours of sleep per night.  Provider recommended increasing patient's trazodone  dosage from 50 mg to 100 mg at bedtime as needed for the management of his sleep.  Patient was agreeable to recommendation.  Patient to continue taking all other medications as prescribed.  Patient's medications to be e-prescribed to pharmacy of choice.  A [place] Suicide Severity Rating Scale was performed with the patient being considered no risk.  Patient denies suicidal ideations and is able to contract for safety following  the conclusion of the encounter.  Collaboration of Care: Collaboration of Care: Medication Management AEB provider managing patient's psychiatric medications, Psychiatrist AEB patient being followed by mental health provider at that facility, and Referral or follow-up with counselor/therapist AEB patient being seen by a licensed clinical social worker at this facility  Patient/Guardian was advised Release of Information must be obtained prior to any record release in order to collaborate their care with an outside provider. Patient/Guardian was advised if they have not already done so to contact the registration department to sign all necessary forms in order for us  to release information regarding their care.   Consent: Patient/Guardian gives verbal consent for treatment and assignment of benefits for services provided during this  visit. Patient/Guardian expressed understanding and agreed to proceed.   1. Insomnia due to other mental disorder  - traZODone  (DESYREL ) 100 MG tablet; Take 1 tablet (100 mg total) by mouth at bedtime as needed for sleep.  Dispense: 30 tablet; Refill: 2  2. Generalized anxiety disorder  - sertraline  (ZOLOFT ) 100 MG tablet; Take 2 tablets (200 mg total) by mouth daily.  Dispense: 60 tablet; Refill: 2 - busPIRone  (BUSPAR ) 15 MG tablet; Take 1 tablet (15 mg total) by mouth 2 (two) times daily.  Dispense: 60 tablet; Refill: 2  3. Moderate episode of recurrent major depressive disorder (HCC)  - sertraline  (ZOLOFT ) 100 MG tablet; Take 2 tablets (200 mg total) by mouth daily.  Dispense: 60 tablet; Refill: 2  Patient to follow up in 2 months Provider spent a total of 14 minutes with the patient/reviewing patient's chart  Reginia FORBES Bolster, PA 11/07/2023, 11:00 AM

## 2023-11-14 ENCOUNTER — Other Ambulatory Visit: Payer: Self-pay

## 2023-11-15 ENCOUNTER — Other Ambulatory Visit: Payer: Self-pay

## 2023-11-15 MED ORDER — NURTEC 75 MG PO TBDP
75.0000 mg | ORAL_TABLET | Freq: Every day | ORAL | 3 refills | Status: AC | PRN
  Filled 2023-11-15: qty 18, 30d supply, fill #0
  Filled 2023-12-25: qty 18, 18d supply, fill #1
  Filled 2024-03-24: qty 18, 30d supply, fill #1

## 2023-11-15 MED ORDER — MELOXICAM 15 MG PO TABS
15.0000 mg | ORAL_TABLET | Freq: Every day | ORAL | 0 refills | Status: AC
  Filled 2023-11-15: qty 90, 90d supply, fill #0

## 2023-11-16 ENCOUNTER — Other Ambulatory Visit: Payer: Self-pay

## 2023-11-20 ENCOUNTER — Ambulatory Visit
Admission: RE | Admit: 2023-11-20 | Discharge: 2023-11-20 | Disposition: A | Discharge status: Home / Self Care | Source: Ambulatory Visit | Attending: Family | Admitting: Family

## 2023-11-20 ENCOUNTER — Other Ambulatory Visit: Payer: Self-pay | Admitting: Family

## 2023-11-20 DIAGNOSIS — M25512 Pain in left shoulder: Secondary | ICD-10-CM

## 2023-12-13 ENCOUNTER — Other Ambulatory Visit: Payer: Self-pay

## 2023-12-13 MED ORDER — REXULTI 0.5 MG PO TABS
0.5000 mg | ORAL_TABLET | Freq: Every day | ORAL | 0 refills | Status: AC
  Filled 2023-12-13 – 2024-02-05 (×3): qty 30, 30d supply, fill #0
  Filled 2024-03-06: qty 90, 90d supply, fill #0
  Filled 2024-03-24: qty 30, 30d supply, fill #0

## 2023-12-25 ENCOUNTER — Other Ambulatory Visit: Payer: Self-pay

## 2023-12-26 ENCOUNTER — Other Ambulatory Visit (HOSPITAL_COMMUNITY): Payer: Self-pay

## 2023-12-26 ENCOUNTER — Other Ambulatory Visit: Payer: Self-pay

## 2023-12-26 MED ORDER — ROSUVASTATIN CALCIUM 10 MG PO TABS
ORAL_TABLET | ORAL | 1 refills | Status: AC
  Filled 2023-12-26: qty 90, 90d supply, fill #0
  Filled 2024-03-24: qty 90, 90d supply, fill #1

## 2023-12-26 MED ORDER — ICOSAPENT ETHYL 1 G PO CAPS
ORAL_CAPSULE | ORAL | 1 refills | Status: AC
  Filled 2023-12-26: qty 360, 90d supply, fill #0
  Filled 2024-03-24: qty 360, 90d supply, fill #1

## 2023-12-27 ENCOUNTER — Other Ambulatory Visit: Payer: Self-pay

## 2023-12-28 ENCOUNTER — Other Ambulatory Visit: Payer: Self-pay

## 2024-01-09 ENCOUNTER — Encounter (HOSPITAL_COMMUNITY): Admitting: Physician Assistant

## 2024-01-25 ENCOUNTER — Other Ambulatory Visit (HOSPITAL_COMMUNITY): Payer: Self-pay

## 2024-01-25 MED ORDER — IBUPROFEN 200 MG PO TABS
600.0000 mg | ORAL_TABLET | Freq: Four times a day (QID) | ORAL | 0 refills | Status: AC | PRN
Start: 2024-01-25 — End: ?
  Filled 2024-01-25: qty 30, 3d supply, fill #0

## 2024-02-04 ENCOUNTER — Other Ambulatory Visit (HOSPITAL_COMMUNITY): Payer: Self-pay

## 2024-02-05 ENCOUNTER — Other Ambulatory Visit: Payer: Self-pay

## 2024-02-14 ENCOUNTER — Other Ambulatory Visit: Payer: Self-pay

## 2024-02-14 ENCOUNTER — Ambulatory Visit (HOSPITAL_COMMUNITY): Admitting: Physician Assistant

## 2024-02-14 ENCOUNTER — Encounter (HOSPITAL_COMMUNITY): Payer: Self-pay | Admitting: Physician Assistant

## 2024-02-14 DIAGNOSIS — F5105 Insomnia due to other mental disorder: Secondary | ICD-10-CM

## 2024-02-14 DIAGNOSIS — F331 Major depressive disorder, recurrent, moderate: Secondary | ICD-10-CM

## 2024-02-14 DIAGNOSIS — F411 Generalized anxiety disorder: Secondary | ICD-10-CM

## 2024-02-14 MED ORDER — BUSPIRONE HCL 15 MG PO TABS
15.0000 mg | ORAL_TABLET | Freq: Two times a day (BID) | ORAL | 1 refills | Status: DC
  Filled 2024-02-14 – 2024-03-06 (×2): qty 60, 30d supply, fill #0

## 2024-02-14 MED ORDER — TRAZODONE HCL 150 MG PO TABS
150.0000 mg | ORAL_TABLET | Freq: Every evening | ORAL | 1 refills | Status: DC | PRN
  Filled 2024-02-14 – 2024-03-06 (×2): qty 30, 30d supply, fill #0

## 2024-02-14 MED ORDER — SERTRALINE HCL 100 MG PO TABS
200.0000 mg | ORAL_TABLET | Freq: Every day | ORAL | 1 refills | Status: DC
  Filled 2024-02-14: qty 60, 30d supply, fill #0
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
## 2024-02-21 ENCOUNTER — Other Ambulatory Visit: Payer: Self-pay

## 2024-02-26 ENCOUNTER — Other Ambulatory Visit: Payer: Self-pay

## 2024-03-04 ENCOUNTER — Other Ambulatory Visit: Payer: Self-pay

## 2024-03-04 MED ORDER — SERTRALINE HCL 100 MG PO TABS
200.0000 mg | ORAL_TABLET | Freq: Every day | ORAL | 3 refills | Status: AC
  Filled 2024-03-04: qty 60, 30d supply, fill #0

## 2024-03-04 MED ORDER — HYDROCHLOROTHIAZIDE 12.5 MG PO TABS
12.5000 mg | ORAL_TABLET | Freq: Every day | ORAL | 1 refills | Status: AC
  Filled 2024-03-04: qty 30, 30d supply, fill #0

## 2024-03-04 MED ORDER — ROSUVASTATIN CALCIUM 10 MG PO TABS: 10.0000 mg | ORAL_TABLET | Freq: Every day | ORAL | 0 refills | Status: AC

## 2024-03-05 ENCOUNTER — Encounter: Payer: Self-pay | Admitting: Ophthalmology

## 2024-03-06 ENCOUNTER — Other Ambulatory Visit: Payer: Self-pay

## 2024-03-07 ENCOUNTER — Other Ambulatory Visit: Payer: Self-pay

## 2024-03-07 MED ORDER — ERGOCALCIFEROL 1.25 MG (50000 UT) PO CAPS
50000.0000 [IU] | ORAL_CAPSULE | ORAL | 1 refills | Status: AC
  Filled 2024-03-07 – 2024-03-24 (×2): qty 4, 28d supply, fill #0

## 2024-03-18 ENCOUNTER — Other Ambulatory Visit: Payer: Self-pay

## 2024-03-24 ENCOUNTER — Other Ambulatory Visit: Payer: Self-pay

## 2024-03-27 ENCOUNTER — Encounter (HOSPITAL_COMMUNITY): Admitting: Physician Assistant

## 2024-04-02 ENCOUNTER — Other Ambulatory Visit: Payer: Self-pay

## 2024-04-23 ENCOUNTER — Encounter (HOSPITAL_COMMUNITY): Admitting: Physician Assistant

## 2024-05-07 ENCOUNTER — Encounter (HOSPITAL_COMMUNITY): Payer: Self-pay | Admitting: Physician Assistant

## 2024-05-07 ENCOUNTER — Ambulatory Visit (HOSPITAL_COMMUNITY): Admitting: Physician Assistant

## 2024-05-07 ENCOUNTER — Other Ambulatory Visit: Payer: Self-pay

## 2024-05-07 DIAGNOSIS — F333 Major depressive disorder, recurrent, severe with psychotic symptoms: Secondary | ICD-10-CM | POA: Diagnosis not present

## 2024-05-07 DIAGNOSIS — F411 Generalized anxiety disorder: Secondary | ICD-10-CM

## 2024-05-07 DIAGNOSIS — F5105 Insomnia due to other mental disorder: Secondary | ICD-10-CM

## 2024-05-07 DIAGNOSIS — F99 Mental disorder, not otherwise specified: Secondary | ICD-10-CM | POA: Diagnosis not present

## 2024-05-07 MED ORDER — BUSPIRONE HCL 10 MG PO TABS
10.0000 mg | ORAL_TABLET | Freq: Two times a day (BID) | ORAL | 1 refills | Status: AC
  Filled 2024-05-07: qty 60, 30d supply, fill #0

## 2024-05-07 MED ORDER — SERTRALINE HCL 100 MG PO TABS
100.0000 mg | ORAL_TABLET | Freq: Every day | ORAL | 1 refills | Status: AC
  Filled 2024-05-07: qty 30, 30d supply, fill #0

## 2024-05-07 MED ORDER — TRAZODONE HCL 150 MG PO TABS
150.0000 mg | ORAL_TABLET | Freq: Every evening | ORAL | 1 refills | Status: AC | PRN
  Filled 2024-05-07: qty 30, 30d supply, fill #0

## 2024-05-07 MED ORDER — ARIPIPRAZOLE 5 MG PO TABS
5.0000 mg | ORAL_TABLET | Freq: Every day | ORAL | 1 refills | Status: AC
  Filled 2024-05-07: qty 30, 30d supply, fill #0

## 2024-05-07 NOTE — Progress Notes (Cosign Needed)
 BH MD/PA/NP OP Progress Note  05/07/2024 2:30 PM JARMAN LITTON  MRN:  [id number]  Chief Complaint:  Chief Complaint  Patient presents with   Follow-up   Medication Management   HPI:   Tirso Laws. Laduke is a 49 year old male with a past psychiatric history significant for major depressive disorder (recurrent, moderate episode), generalized anxiety disorder, and insomnia who presents to [facility] for follow-up and medication management.  Patient is currently being managed on the following psychiatric medications:  Sertraline  200 mg daily Buspirone  15 mg 2 times daily Trazodone  150 mg at bedtime as needed  Patient presents to the encounter stating that his medications are too strong.  He reports that when he takes his medications, he experiences elevated heart rate and lightheadedness.  He reports that he is unsure which medication is causing these issues.  He continues to endorse depression but states that his episodes have not been as bad.  Patient reports that he has been taking half the dose of his Zoloft  when treating his depression.  Patient rates his depression an 8-9 out of 10 with 10 being most severe.  Patient endorses depressive episodes 3 days/week.  Patient endorses the following depressive symptoms: feelings of sadness, self-isolation, lack of motivation, decreased concentration, decreased energy, feelings of guilt/worthlessness, and hopelessness.  Patient denies irritability.  In addition to his depression, patient reports that he feels that he may be hearing voices.  He reports that he hears voices from outside of the home but never sees anyone outside.  He reports that this causes him paranoia.  He reports that the voices are negative in nature and always directed towards him.  He reports that his neighbors have talked about him in the past and believes that the voices could be coming from them.  Patient also reports that his anxiety is still  present but when taking his medications, he seems to calm down.  A PHQ-9 screen was performed with the patient scoring a 23.  A GAD-7 screen was also performed with the patient scoring a 21.  Patient is alert and oriented x 4, calm, cooperative, and fully engaged in conversation during the encounter.  Patient describes his mood as fine and states that he has been staying busy.  Patient denies suicidal or homicidal ideations.  He further denies auditory or visual hallucinations and does not appear to be responding to internal/external stimuli.  Patient endorses fair sleep and receives on average 6 to 7 hours of sleep per night.  Patient states that he often naps during the day.  Patient endorses fair appetite and eats on average 1-2 meals per day.  Patient denies alcohol consumption or illicit drug use.  Patient endorses tobacco use and smokes on average a pack per day.  Visit Diagnosis:    ICD-10-CM   1. Insomnia due to other mental disorder  F51.05 traZODone  (DESYREL ) 150 MG tablet   F99     2. Generalized anxiety disorder  F41.1 sertraline  (ZOLOFT ) 100 MG tablet    busPIRone  (BUSPAR ) 10 MG tablet    3. Severe episode of recurrent major depressive disorder, with psychotic features (HCC)  F33.3 sertraline  (ZOLOFT ) 100 MG tablet    ARIPiprazole  (ABILIFY ) 5 MG tablet      Past Psychiatric History:  Patient endorses a past psychiatric history significant for depression and anxiety. - Patient has been diagnosed with major depressive disorder and generalized anxiety disorder   Patient denies a past history of hospitalization due to  mental health   Patient denies a past history of suicide attempt   Patient denies a past history of homicide attempt  Past Medical History: History reviewed. No pertinent past medical history.  Past Surgical History:  Procedure Laterality Date   LEG SURGERY      Family Psychiatric History:  Patient reports that his mother's side of the family have a history of  schizophrenia.  Patient reports that alcohol abuse runs in the family.   Family history of suicide attempt: Patient denies Family history of homicide attempt: Patient denies Family history of substance abuse: Patient endorses a family history of alcohol abuse  Family History: History reviewed. No pertinent family history.  Social History:  Social History   Socioeconomic History   Marital status: Married    Spouse name: Not on file   Number of children: Not on file   Years of education: Not on file   Highest education level: Not on file  Occupational History   Not on file  Tobacco Use   Smoking status: Every Day    Types: Cigarettes   Smokeless tobacco: Not on file  Substance and Sexual Activity   Alcohol use: Yes   Drug use: Yes    Types: Methamphetamines   Sexual activity: Not on file  Other Topics Concern   Not on file  Social History Narrative   Not on file   Social Drivers of Health   Tobacco Use: High Risk (05/07/2024)   Patient History    Smoking Tobacco Use: Every Day    Smokeless Tobacco Use: Unknown    Passive Exposure: Not on file  Financial Resource Strain: Not on file  Food Insecurity: Not on file  Transportation Needs: Not on file  Physical Activity: Not on file  Stress: Not on file  Social Connections: Not on file  Depression (PHQ2-9): High Risk (05/07/2024)   Depression (PHQ2-9)    PHQ-2 Score: 23  Alcohol Screen: Not on file  Housing: Not on file  Utilities: Not on file  Health Literacy: Not on file    Allergies: No Known Allergies  Metabolic Disorder Labs: No results found for: HGBA1C, MPG No results found for: PROLACTIN No results found for: CHOL, TRIG, HDL, CHOLHDL, VLDL, LDLCALC No results found for: TSH  Therapeutic Level Labs: No results found for: LITHIUM No results found for: VALPROATE No results found for: CBMZ  Current Medications: Current Outpatient Medications  Medication Sig Dispense Refill    ARIPiprazole  (ABILIFY ) 5 MG tablet Take 1 tablet (5 mg total) by mouth daily. 30 tablet 1   Aspirin-Salicylamide-Caffeine (BC HEADACHE POWDER PO) Take 1 packet by mouth 3 (three) times daily as needed (pain).     Brexpiprazole  (REXULTI ) 0.5 MG TABS Take 1 tablet (0.5 mg total) by mouth daily. 90 tablet 0   busPIRone  (BUSPAR ) 10 MG tablet Take 1 tablet (10 mg total) by mouth 2 (two) times daily. 60 tablet 1   doxycycline  (VIBRAMYCIN ) 100 MG capsule Take 1 capsule (100 mg total) by mouth 2 (two) times daily. One po bid x 7 days (Patient not taking: Reported on 04/21/2014) 14 capsule 0   ergocalciferol  (VITAMIN D2) 1.25 MG (50000 UT) capsule Take 1 capsule (50,000 Units total) by mouth once a week. 4 capsule 1   hydrochlorothiazide  (HYDRODIURIL ) 12.5 MG tablet Take 1 tablet (12.5 mg total) by mouth daily. 30 tablet 1   ibuprofen  (ADVIL ) 200 MG tablet Take 3 tablets (600 mg total) by mouth every 6 (six) hours as needed for  mild pain (1 - 3). 30 tablet 0   ibuprofen  (ADVIL ,MOTRIN ) 800 MG tablet Take 1 tablet (800 mg total) by mouth 3 (three) times daily. 21 tablet 0   icosapent  Ethyl (VASCEPA ) 1 g capsule Take 2 capsules with meals Orally Twice a day 360 capsule 1   meloxicam  (MOBIC ) 15 MG tablet Take 1 tablet (15 mg total) by mouth daily. 90 tablet 0   oxyCODONE -acetaminophen  (PERCOCET/ROXICET) 5-325 MG per tablet Take 1 tablet by mouth every 8 (eight) hours as needed for moderate pain or severe pain. 7 tablet 0   penicillin  v potassium (VEETID) 500 MG tablet Take 1 tablet (500 mg total) by mouth 4 (four) times daily. 40 tablet 0   Rimegepant Sulfate  (NURTEC) 75 MG TBDP Place 1 tablet (75 mg total) on the tongue and allow to dissolve daily as needed. 30 tablet 3   rosuvastatin  (CRESTOR ) 10 MG tablet Take 1 tablet Orally Once a day 90 tablet 1   rosuvastatin  (CRESTOR ) 10 MG tablet Take 1 tablet (10 mg total) by mouth daily. 90 tablet 0   sertraline  (ZOLOFT ) 100 MG tablet Take 2 tablets (200 mg total) by  mouth daily. 60 tablet 3   sertraline  (ZOLOFT ) 100 MG tablet Take 1 tablet (100 mg total) by mouth daily. 30 tablet 1   traZODone  (DESYREL ) 150 MG tablet Take 1 tablet (150 mg total) by mouth at bedtime as needed for sleep. 30 tablet 1   No current facility-administered medications for this visit.     Musculoskeletal: Strength & Muscle Tone: within normal limits Gait & Station: normal Patient leans: N/A  Psychiatric Specialty Exam: Review of Systems  Psychiatric/Behavioral:  Positive for dysphoric mood and sleep disturbance. Negative for decreased concentration, hallucinations, self-injury and suicidal ideas. The patient is nervous/anxious. The patient is not hyperactive.     Blood pressure 131/82, pulse 69, temperature 98.3 F (36.8 C), temperature source Oral, height 6' 4 (1.93 m), weight 207 lb 12.8 oz (94.3 kg), SpO2 97%.Body mass index is 25.29 kg/m.  General Appearance: Casual  Eye Contact:  Good  Speech:  Clear and Coherent and Normal Rate  Volume:  Normal  Mood:  Anxious and Depressed  Affect:  Congruent  Thought Process:  Coherent, Goal Directed, and Descriptions of Associations: Intact  Orientation:  Full (Time, Place, and Person)  Thought Content: WDL   Suicidal Thoughts:  No  Homicidal Thoughts:  No  Memory:  Immediate;   Good Recent;   Fair Remote;   Fair  Judgement:  Good  Insight:  Good  Psychomotor Activity:  Normal  Concentration:  Concentration: Good and Attention Span: Good  Recall:  Good  Fund of Knowledge: Fair  Language: Good  Akathisia:  No  Handed:  Right  AIMS (if indicated): not done  Assets:  Communication Skills Desire for Improvement Housing Social Support  ADL's:  Intact  Cognition: WNL  Sleep:  Poor   Screenings: GAD-7    Flowsheet Row Clinical Support from 05/07/2024 in [facility] Clinical Support from 02/14/2024 in [facility] Clinical Support from 11/07/2023 in [facility] Clinical Support from 09/21/2023 in [facility] Clinical Support from 07/25/2023 in [facility]  Total GAD-7 Score 21 21 20 21 21    PHQ2-9    Flowsheet Row Clinical Support from 05/07/2024 in [facility] Clinical Support from 02/14/2024 in [facility] Clinical Support from 11/07/2023 in  [facility] Clinical Support from 09/21/2023 in [facility] Clinical Support from 07/25/2023 in [facility]  PHQ-2 Total Score 6 6 2 4 6   PHQ-9 Total Score 23 23 11 17 19    Flowsheet Row Clinical Support from 05/07/2024 in [facility] Clinical Support from 02/14/2024 in [facility] Clinical Support from 11/07/2023 in [facility]  C-SSRS RISK CATEGORY No Risk No Risk No Risk     Assessment and Plan:   Eduardo Mcdowell. Rawl is a 49 year old male with a past psychiatric history significant for major depressive disorder (recurrent, moderate episode), generalized anxiety disorder, and insomnia who presents to [facility] for follow-up and medication management.  Patient presents to the encounter stating that his medications appear to be too strong.  He reports that whenever he takes his medications, he experiences elevated heart rate and lightheadedness.  Patient is unsure of which medication is causing this issue.  Patient reports that he is currently taking half the dosage of his Zoloft  to manage his depression.  Patient continues to endorse ongoing depression and anxiety.  He reports that when he takes his medications, his anxiety appears to calm down. A PHQ-9 screen was performed with the patient scoring a 23.  A GAD-7 screen was also performed with the patient scoring a 21.  In  addition to his depression and anxiety, patient reports that he has been hearing voices.  He reports that the voices are negative in nature and always directed towards him.  He reports that he is unsure if he is truly having auditory hallucinations or if he is hearing his neighbors talk about him.  Patient would like to continue taking his Zoloft  at 100 mg daily for the management of his depressive symptoms and anxiety.  Provider was also interested in lowering his buspirone  dosage from 15 mg to 10 mg 2 times daily for the management of his anxiety.  Patient's medications to be adjusted accordingly.  Patient is interested in starting a medication for the management of his possible auditory hallucinations.  Provider recommended patient be placed on Abilify  5 mg daily for the management of his depressive symptoms and for psychotic symptoms.  Patient was agreeable to recommendation.  Patient's medications to be e-prescribed to pharmacy of choice.  Provider discussed side effects associated with the use of Abilify  prior to the conclusion of the encounter.  Patient verbalized understanding.  A [place] Suicide Severity Rating Scale was performed with the patient being considered no risk.  Patient denies suicidal ideations and is able to contract for safety following the conclusion of the encounter.  Collaboration of Care: Collaboration of Care: Medication Management AEB provider managing patient's psychiatric medications, Psychiatrist AEB patient being followed by mental health provider at that facility, and Referral or follow-up with counselor/therapist AEB patient being seen by a licensed clinical social worker at this facility  Patient/Guardian was advised Release of Information must be obtained prior to any record release in order to collaborate their care with an outside provider. Patient/Guardian was advised if they have not already done so to contact the registration department to sign all necessary forms  in order for us  to release information regarding their care.   Consent: Patient/Guardian gives verbal consent for treatment and assignment of benefits for services provided during this visit. Patient/Guardian expressed understanding and agreed to proceed.   1. Insomnia due to other mental disorder  -  traZODone  (DESYREL ) 150 MG tablet; Take 1 tablet (150 mg total) by mouth at bedtime as needed for sleep.  Dispense: 30 tablet; Refill: 1  2. Generalized anxiety disorder  - sertraline  (ZOLOFT ) 100 MG tablet; Take 1 tablet (100 mg total) by mouth daily.  Dispense: 30 tablet; Refill: 1 - busPIRone  (BUSPAR ) 10 MG tablet; Take 1 tablet (10 mg total) by mouth 2 (two) times daily.  Dispense: 60 tablet; Refill: 1  3. Severe episode of recurrent major depressive disorder, with psychotic features (HCC)  - sertraline  (ZOLOFT ) 100 MG tablet; Take 1 tablet (100 mg total) by mouth daily.  Dispense: 30 tablet; Refill: 1 - ARIPiprazole  (ABILIFY ) 5 MG tablet; Take 1 tablet (5 mg total) by mouth daily.  Dispense: 30 tablet; Refill: 1  Patient to follow up in 6 weeks Provider spent a total of 20 minutes with the patient/reviewing patient's chart  Reginia FORBES Bolster, PA 05/07/2024, 2:30 PM

## 2024-05-12 ENCOUNTER — Other Ambulatory Visit: Payer: Self-pay

## 2024-05-21 ENCOUNTER — Other Ambulatory Visit: Payer: Self-pay

## 2024-06-18 ENCOUNTER — Encounter (HOSPITAL_COMMUNITY): Admitting: Physician Assistant
